# Patient Record
Sex: Female | Born: 1960 | Race: White | Hispanic: No | Marital: Married | Smoking: Never smoker
Health system: Southern US, Community
[De-identification: ages and names within clinical notes are randomized; demographics above are authoritative.]

## PROBLEM LIST (undated history)

## (undated) DIAGNOSIS — Z85828 Personal history of other malignant neoplasm of skin: Secondary | ICD-10-CM

## (undated) DIAGNOSIS — I10 Essential (primary) hypertension: Secondary | ICD-10-CM

## (undated) DIAGNOSIS — Z9889 Other specified postprocedural states: Secondary | ICD-10-CM

## (undated) DIAGNOSIS — N8189 Other female genital prolapse: Secondary | ICD-10-CM

## (undated) DIAGNOSIS — R350 Frequency of micturition: Secondary | ICD-10-CM

## (undated) HISTORY — PX: TUBAL LIGATION: SHX77

---

## 1998-02-07 ENCOUNTER — Ambulatory Visit (HOSPITAL_COMMUNITY): Admission: RE | Admit: 1998-02-07 | Discharge: 1998-02-07 | Payer: Self-pay | Admitting: Family Medicine

## 1998-02-07 ENCOUNTER — Encounter: Payer: Self-pay | Admitting: Family Medicine

## 2000-01-02 ENCOUNTER — Encounter: Payer: Self-pay | Admitting: Family Medicine

## 2000-01-02 ENCOUNTER — Encounter: Admission: RE | Admit: 2000-01-02 | Discharge: 2000-01-02 | Payer: Self-pay | Admitting: Family Medicine

## 2000-06-08 ENCOUNTER — Other Ambulatory Visit: Admission: RE | Admit: 2000-06-08 | Discharge: 2000-06-08 | Payer: Self-pay | Admitting: Obstetrics and Gynecology

## 2000-07-25 ENCOUNTER — Encounter: Payer: Self-pay | Admitting: Neurosurgery

## 2000-07-25 ENCOUNTER — Encounter: Admission: RE | Admit: 2000-07-25 | Discharge: 2000-07-25 | Payer: Self-pay | Admitting: Neurosurgery

## 2000-08-09 ENCOUNTER — Encounter: Admission: RE | Admit: 2000-08-09 | Discharge: 2000-08-09 | Payer: Self-pay | Admitting: Neurosurgery

## 2000-08-09 ENCOUNTER — Encounter: Payer: Self-pay | Admitting: Neurosurgery

## 2001-08-21 ENCOUNTER — Encounter: Admission: RE | Admit: 2001-08-21 | Discharge: 2001-08-21 | Payer: Self-pay | Admitting: Family Medicine

## 2001-08-21 ENCOUNTER — Encounter: Payer: Self-pay | Admitting: Family Medicine

## 2001-08-24 ENCOUNTER — Other Ambulatory Visit: Admission: RE | Admit: 2001-08-24 | Discharge: 2001-08-24 | Payer: Self-pay | Admitting: Obstetrics and Gynecology

## 2001-12-21 ENCOUNTER — Ambulatory Visit (HOSPITAL_COMMUNITY): Admission: RE | Admit: 2001-12-21 | Discharge: 2001-12-21 | Payer: Self-pay | Admitting: *Deleted

## 2001-12-21 ENCOUNTER — Encounter: Payer: Self-pay | Admitting: *Deleted

## 2002-09-12 ENCOUNTER — Other Ambulatory Visit: Admission: RE | Admit: 2002-09-12 | Discharge: 2002-09-12 | Payer: Self-pay | Admitting: Obstetrics and Gynecology

## 2002-09-20 ENCOUNTER — Encounter: Payer: Self-pay | Admitting: Obstetrics and Gynecology

## 2002-09-20 ENCOUNTER — Encounter: Admission: RE | Admit: 2002-09-20 | Discharge: 2002-09-20 | Payer: Self-pay | Admitting: Obstetrics and Gynecology

## 2003-10-16 ENCOUNTER — Other Ambulatory Visit: Admission: RE | Admit: 2003-10-16 | Discharge: 2003-10-16 | Payer: Self-pay | Admitting: Obstetrics and Gynecology

## 2004-01-15 ENCOUNTER — Ambulatory Visit (HOSPITAL_COMMUNITY): Admission: RE | Admit: 2004-01-15 | Discharge: 2004-01-15 | Payer: Self-pay | Admitting: Obstetrics and Gynecology

## 2004-12-01 ENCOUNTER — Other Ambulatory Visit: Admission: RE | Admit: 2004-12-01 | Discharge: 2004-12-01 | Payer: Self-pay | Admitting: Obstetrics and Gynecology

## 2004-12-02 HISTORY — PX: LAPAROSCOPIC ASSISTED VAGINAL HYSTERECTOMY: SHX5398

## 2004-12-17 ENCOUNTER — Observation Stay (HOSPITAL_COMMUNITY): Admission: RE | Admit: 2004-12-17 | Discharge: 2004-12-18 | Payer: Self-pay | Admitting: Obstetrics and Gynecology

## 2010-06-04 ENCOUNTER — Institutional Professional Consult (permissible substitution): Payer: Self-pay | Admitting: Cardiology

## 2010-06-20 ENCOUNTER — Encounter: Payer: Self-pay | Admitting: Cardiology

## 2010-06-24 ENCOUNTER — Encounter: Payer: Self-pay | Admitting: Cardiology

## 2010-06-24 ENCOUNTER — Ambulatory Visit (INDEPENDENT_AMBULATORY_CARE_PROVIDER_SITE_OTHER): Payer: 59 | Admitting: Cardiology

## 2010-06-24 DIAGNOSIS — E7800 Pure hypercholesterolemia, unspecified: Secondary | ICD-10-CM

## 2010-06-24 DIAGNOSIS — I10 Essential (primary) hypertension: Secondary | ICD-10-CM | POA: Insufficient documentation

## 2010-06-24 DIAGNOSIS — E7801 Familial hypercholesterolemia: Secondary | ICD-10-CM

## 2010-06-24 NOTE — Progress Notes (Signed)
   Patient ID: Melissa [name], female    DOB: 1961/01/31, 50 y.o.   MRN: [id number]  HPI  Melissa [name] is referred by Dr Arelia Sneddon for the evaluation and management of her CRF's. She has a history of HTN that is well controlled for the last 5 years. Her TC is 235 but her HDL she thinks is 60. She will send [place] the numbers. Her father had a heart attack at age 50 but was a smoker. Her brother had an MI at 96 and was not. She is sedentary and is overweight.  She denies angina or chest pain.    Review of Systems  All other systems reviewed and are negative.      Physical Exam  Nursing note and vitals reviewed. Constitutional: She is oriented to person, place, and time. She appears well-developed and well-nourished.       overweight  HENT:  Head: Normocephalic and atraumatic.  Eyes: EOM are normal. Pupils are equal, round, and reactive to light.  Neck: Normal range of motion. Neck supple. No JVD present. No tracheal deviation present. No thyromegaly present.  Cardiovascular: Normal rate, regular rhythm, normal heart sounds and intact distal pulses.  Exam reveals no gallop.   No murmur heard. Pulmonary/Chest: Effort normal and breath sounds normal.  Abdominal: Soft. Bowel sounds are normal.  Musculoskeletal: Normal range of motion. She exhibits no edema.  Neurological: She is alert and oriented to person, place, and time.  Skin: Skin is warm and dry.  Psychiatric: She has a normal mood and affect.

## 2010-06-24 NOTE — Patient Instructions (Signed)
Your physician discussed the importance of regular exercise and recommended that you start or continue a regular exercise program for good health.   30 minutes a day for a total of 3 hours per week Your physician encouraged you to lose weight for better health.  Your physician recommends that you schedule a follow-up appointment in: 1 year with Dr. Daleen Squibb

## 2010-10-16 ENCOUNTER — Emergency Department (HOSPITAL_COMMUNITY)
Admission: EM | Admit: 2010-10-16 | Discharge: 2010-10-16 | Disposition: A | Discharge status: Home / Self Care | Payer: Managed Care, Other (non HMO) | Attending: Emergency Medicine | Admitting: Emergency Medicine

## 2010-10-16 DIAGNOSIS — M549 Dorsalgia, unspecified: Secondary | ICD-10-CM | POA: Insufficient documentation

## 2010-10-16 DIAGNOSIS — F3289 Other specified depressive episodes: Secondary | ICD-10-CM | POA: Insufficient documentation

## 2010-10-16 DIAGNOSIS — R42 Dizziness and giddiness: Secondary | ICD-10-CM | POA: Insufficient documentation

## 2010-10-16 DIAGNOSIS — I10 Essential (primary) hypertension: Secondary | ICD-10-CM | POA: Insufficient documentation

## 2010-10-16 DIAGNOSIS — Z79899 Other long term (current) drug therapy: Secondary | ICD-10-CM | POA: Insufficient documentation

## 2010-10-16 DIAGNOSIS — F329 Major depressive disorder, single episode, unspecified: Secondary | ICD-10-CM | POA: Insufficient documentation

## 2010-10-16 DIAGNOSIS — E785 Hyperlipidemia, unspecified: Secondary | ICD-10-CM | POA: Insufficient documentation

## 2010-10-16 DIAGNOSIS — R079 Chest pain, unspecified: Secondary | ICD-10-CM | POA: Insufficient documentation

## 2010-10-16 DIAGNOSIS — R109 Unspecified abdominal pain: Secondary | ICD-10-CM | POA: Insufficient documentation

## 2010-10-16 DIAGNOSIS — K219 Gastro-esophageal reflux disease without esophagitis: Secondary | ICD-10-CM | POA: Insufficient documentation

## 2010-10-16 LAB — POCT I-STAT, CHEM 8
Chloride: 104 mEq/L (ref 96–112)
Creatinine, Ser: 0.9 mg/dL (ref 0.50–1.10)
Hemoglobin: 11.2 g/dL — ABNORMAL LOW (ref 12.0–15.0)
Potassium: 4 mEq/L (ref 3.5–5.1)
Sodium: 141 mEq/L (ref 135–145)

## 2010-10-16 LAB — POCT I-STAT TROPONIN I: Troponin i, poc: 0 ng/mL (ref 0.00–0.08)

## 2012-10-09 ENCOUNTER — Other Ambulatory Visit: Payer: Self-pay | Admitting: Neurosurgery

## 2012-10-09 DIAGNOSIS — M4802 Spinal stenosis, cervical region: Secondary | ICD-10-CM

## 2012-10-12 ENCOUNTER — Other Ambulatory Visit: Payer: Self-pay | Admitting: Dermatology

## 2012-10-13 ENCOUNTER — Ambulatory Visit
Admission: RE | Admit: 2012-10-13 | Discharge: 2012-10-13 | Disposition: A | Discharge status: Home / Self Care | Payer: Managed Care, Other (non HMO) | Source: Ambulatory Visit | Attending: Neurosurgery | Admitting: Neurosurgery

## 2012-10-13 VITALS — BP 108/63 | HR 57

## 2012-10-13 DIAGNOSIS — M4802 Spinal stenosis, cervical region: Secondary | ICD-10-CM

## 2012-10-13 MED ORDER — MEPERIDINE HCL 100 MG/ML IJ SOLN
75.0000 mg | Freq: Once | INTRAMUSCULAR | Status: AC
  Administered 2012-10-13: 75 mg via INTRAMUSCULAR

## 2012-10-13 MED ORDER — IOHEXOL 300 MG/ML  SOLN
10.0000 mL | Freq: Once | INTRAMUSCULAR | Status: AC | PRN
  Administered 2012-10-13: 10 mL via INTRATHECAL

## 2012-10-13 MED ORDER — ONDANSETRON HCL 4 MG/2ML IJ SOLN
4.0000 mg | Freq: Once | INTRAMUSCULAR | Status: AC
  Administered 2012-10-13: 4 mg via INTRAMUSCULAR

## 2012-10-13 MED ORDER — DIAZEPAM 5 MG PO TABS
10.0000 mg | ORAL_TABLET | Freq: Once | ORAL | Status: AC
  Administered 2012-10-13: 10 mg via ORAL

## 2012-10-13 NOTE — Progress Notes (Signed)
Pt states she has been off paxil for the past 2 days.

## 2012-10-16 ENCOUNTER — Ambulatory Visit
Admission: RE | Admit: 2012-10-16 | Discharge: 2012-10-16 | Disposition: A | Discharge status: Home / Self Care | Payer: Managed Care, Other (non HMO) | Source: Ambulatory Visit | Attending: Neurosurgery | Admitting: Neurosurgery

## 2012-10-16 ENCOUNTER — Telehealth: Payer: Self-pay | Admitting: Radiology

## 2012-10-16 ENCOUNTER — Other Ambulatory Visit: Payer: Self-pay | Admitting: Neurosurgery

## 2012-10-16 DIAGNOSIS — G971 Other reaction to spinal and lumbar puncture: Secondary | ICD-10-CM

## 2012-10-16 MED ORDER — IOHEXOL 180 MG/ML  SOLN
1.0000 mL | Freq: Once | INTRAMUSCULAR | Status: AC | PRN
  Administered 2012-10-16: 1 mL via EPIDURAL

## 2012-10-16 NOTE — Progress Notes (Signed)
Dr. Carlota Raspberry in to speak with patient after Blood Patch and vasovagal reaction.  jkl

## 2012-10-16 NOTE — Telephone Encounter (Signed)
Pt called, had myelo on Friday and has been on bedrest all weekend. This am severe headache with nausea and vomiting. Will call Dr. Jeral Fruit for a blood patch order

## 2012-10-16 NOTE — Progress Notes (Signed)
20cc blood drawn from left AC space without difficulty for procedure; site unremarkable.  jkl 

## 2013-04-25 ENCOUNTER — Other Ambulatory Visit: Payer: Self-pay | Admitting: Dermatology

## 2013-06-06 ENCOUNTER — Other Ambulatory Visit: Payer: Self-pay | Admitting: Family Medicine

## 2013-06-06 ENCOUNTER — Ambulatory Visit
Admission: RE | Admit: 2013-06-06 | Discharge: 2013-06-06 | Disposition: A | Discharge status: Home / Self Care | Payer: Managed Care, Other (non HMO) | Source: Ambulatory Visit | Attending: Family Medicine | Admitting: Family Medicine

## 2013-06-06 DIAGNOSIS — M79609 Pain in unspecified limb: Secondary | ICD-10-CM

## 2013-07-31 ENCOUNTER — Other Ambulatory Visit: Payer: Self-pay | Admitting: Family Medicine

## 2013-07-31 ENCOUNTER — Ambulatory Visit
Admission: RE | Admit: 2013-07-31 | Discharge: 2013-07-31 | Disposition: A | Discharge status: Home / Self Care | Payer: Managed Care, Other (non HMO) | Source: Ambulatory Visit | Attending: Family Medicine | Admitting: Family Medicine

## 2013-07-31 DIAGNOSIS — S20212A Contusion of left front wall of thorax, initial encounter: Secondary | ICD-10-CM

## 2015-07-01 NOTE — H&P (Signed)
  Patient name Melissa [name], Elisama DICTATION# [id number] CSN# [id number]  [facility],Lizette Pazos S, MD 07/01/2015 5:58 PM

## 2015-07-01 NOTE — H&P (Signed)
NAME:  Melissa [name], Melissa [name]        ACCOUNT NO.:  [id number]  MEDICAL RECORD NO.:  [id number]  LOCATION:                                 FACILITY:  PHYSICIAN:  Juluis MireJohn S. Malarie Tappen, M.D.        DATE OF BIRTH:  DATE OF ADMISSION: DATE OF DISCHARGE:                             HISTORY & PHYSICAL   DATE OF SURGERY:  June 8th at [facility].  HISTORY OF PRESENT ILLNESS:  The patient is a 55 year old, gravida 1, para 1 female who presents for repair of pelvic relaxation.  She has a known rectocele.  It has become more symptomatic.  We had discussed options including surgical repair of pessary.  She is in favor of proceeding with surgery.  We are going to have it set, although we may also do an anterior repair and sacrospinous ligament suspension if needed.  The nature of procedure and alternatives have been discussed.  ALLERGIES:  She has no known drug allergies.  MEDICATIONS:  She is on lisinopril with hydrochlorothiazide, Paxil 10 mg daily, and a baby aspirin.  PAST MEDICAL HISTORY:  She had a previous laparoscopic-assisted vaginal hysterectomy, that was done in 2006 for management of adenomyosis.  She has had 1 vaginal delivery.  She has had a previous laparoscopic tubal.  FAMILY HISTORY:  There is a strong family history of heart disease.  She has seen a cardiologist in the past where workup was unremarkable. Otherwise, family history is noncontributory.  SOCIAL HISTORY:  Reveals no tobacco or alcohol use.  REVIEW OF SYSTEMS:  Noncontributory.  PHYSICAL EXAMINATION:  VITAL SIGNS:  The patient is afebrile with stable vital signs. HEENT:  The patient is normocephalic.  Pupils are equal, round, and reactive to light and accommodation.  Extraocular movements were intact. Sclerae and conjunctivae were clear.  Oropharynx was clear. BREASTS:  Not examined. LUNGS:  Clear. CARDIOVASCULAR:  Regular rhythm and rate without murmurs or gallops. There were no carotid or  abdominal bruits. ABDOMEN:  Abdominal exam is benign.  No mass, organomegaly, or tenderness. PELVIC:  Normal external genitalia.  Vaginal mucosa reveals a very prominent rectocele.  Minimal cystocele.  Cuff intact.  Bimanual is unremarkable. EXTREMITIES:  Trace edema. NEUROLOGIC:  Grossly within normal limits.  IMPRESSION:  Worsening pelvic relaxation mainly in the form of rectocele.  PLAN:  We are going to proceed with repair probably just of the rectocele, but leave it open for an anterior repair and sacrospinous ligament suspension.  The nature of the procedure have been discussed. The risks have been explained.  The risks include risk of infection, risk of hemorrhage that could require transfusion with the risk of AIDS, hepatitis.  There is a risk of injury to organs including bladder, bowel, ureters that could require further exploratory surgery.  Risk of deep venous thrombosis and pulmonary embolus.  There was also the potential for recurrent pelvic relaxation and other associated complications.  The patient voiced understanding of the indications, risks, and alternatives.     Juluis MireJohn S. Jahree Dermody, M.D.     JSM/MEDQ  D:  07/01/2015  T:  07/01/2015  Job:  [id number]

## 2015-07-07 ENCOUNTER — Encounter (HOSPITAL_BASED_OUTPATIENT_CLINIC_OR_DEPARTMENT_OTHER): Payer: Self-pay | Admitting: *Deleted

## 2015-07-07 NOTE — Progress Notes (Signed)
NPO AFTER MN.  ARRIVE AT 0600.  NEEDS EKG.  GETTING LAB WORK DONE Tuesday 07-08-2015 (CBC , CMET).  WILL TAKE PAXIL AM DOS W/ SIPS OF WATER.

## 2015-07-09 DIAGNOSIS — N816 Rectocele: Secondary | ICD-10-CM | POA: Diagnosis present

## 2015-07-09 DIAGNOSIS — I10 Essential (primary) hypertension: Secondary | ICD-10-CM | POA: Diagnosis not present

## 2015-07-09 DIAGNOSIS — N8110 Cystocele, unspecified: Secondary | ICD-10-CM | POA: Diagnosis not present

## 2015-07-09 LAB — COMPREHENSIVE METABOLIC PANEL
ALBUMIN: 4.5 g/dL (ref 3.5–5.0)
ALK PHOS: 83 U/L (ref 38–126)
ALT: 15 U/L (ref 14–54)
AST: 14 U/L — AB (ref 15–41)
Anion gap: 7 (ref 5–15)
BUN: 25 mg/dL — ABNORMAL HIGH (ref 6–20)
CALCIUM: 9.3 mg/dL (ref 8.9–10.3)
CHLORIDE: 106 mmol/L (ref 101–111)
CO2: 27 mmol/L (ref 22–32)
CREATININE: 0.82 mg/dL (ref 0.44–1.00)
GFR calc Af Amer: >60 mL/min (ref >60)
GFR calc non Af Amer: >60 mL/min (ref >60)
GLUCOSE: 92 mg/dL (ref 65–99)
Potassium: 4 mmol/L (ref 3.5–5.1)
SODIUM: 140 mmol/L (ref 135–145)
Total Bilirubin: 0.7 mg/dL (ref 0.3–1.2)
Total Protein: 7.6 g/dL (ref 6.5–8.1)

## 2015-07-09 LAB — CBC
HCT: 37.1 % (ref 36.0–46.0)
HEMOGLOBIN: 12.1 g/dL (ref 12.0–15.0)
MCH: 28.9 pg (ref 26.0–34.0)
MCHC: 32.6 g/dL (ref 30.0–36.0)
MCV: 88.5 fL (ref 78.0–100.0)
PLATELETS: 220 10*3/uL (ref 150–400)
RBC: 4.19 MIL/uL (ref 3.87–5.11)
RDW: 13.3 % (ref 11.5–15.5)
WBC: 5.2 10*3/uL (ref 4.0–10.5)

## 2015-07-09 LAB — ABO/RH: ABO/RH(D): O POS

## 2015-07-09 LAB — TYPE AND SCREEN
ABO/RH(D): O POS
Antibody Screen: NEGATIVE

## 2015-07-10 ENCOUNTER — Encounter (HOSPITAL_BASED_OUTPATIENT_CLINIC_OR_DEPARTMENT_OTHER): Payer: Self-pay | Admitting: Anesthesiology

## 2015-07-10 ENCOUNTER — Other Ambulatory Visit: Payer: Self-pay

## 2015-07-10 ENCOUNTER — Ambulatory Visit (HOSPITAL_BASED_OUTPATIENT_CLINIC_OR_DEPARTMENT_OTHER): Payer: Managed Care, Other (non HMO) | Admitting: Anesthesiology

## 2015-07-10 ENCOUNTER — Ambulatory Visit (HOSPITAL_BASED_OUTPATIENT_CLINIC_OR_DEPARTMENT_OTHER)
Admission: RE | Admit: 2015-07-10 | Discharge: 2015-07-11 | Disposition: A | Discharge status: Home / Self Care | Payer: Managed Care, Other (non HMO) | Source: Ambulatory Visit | Attending: Obstetrics and Gynecology | Admitting: Obstetrics and Gynecology

## 2015-07-10 ENCOUNTER — Encounter (HOSPITAL_COMMUNITY)
Admission: RE | Disposition: A | Discharge status: Home / Self Care | Payer: Self-pay | Source: Ambulatory Visit | Attending: Obstetrics and Gynecology

## 2015-07-10 DIAGNOSIS — I10 Essential (primary) hypertension: Secondary | ICD-10-CM | POA: Insufficient documentation

## 2015-07-10 DIAGNOSIS — N816 Rectocele: Secondary | ICD-10-CM | POA: Insufficient documentation

## 2015-07-10 DIAGNOSIS — N8189 Other female genital prolapse: Secondary | ICD-10-CM | POA: Diagnosis present

## 2015-07-10 DIAGNOSIS — N8110 Cystocele, unspecified: Secondary | ICD-10-CM | POA: Insufficient documentation

## 2015-07-10 HISTORY — PX: ANTERIOR AND POSTERIOR REPAIR WITH SACROSPINOUS FIXATION: SHX6536

## 2015-07-10 HISTORY — DX: Other specified postprocedural states: Z98.890

## 2015-07-10 HISTORY — DX: Personal history of other malignant neoplasm of skin: Z85.828

## 2015-07-10 HISTORY — DX: Essential (primary) hypertension: I10

## 2015-07-10 HISTORY — DX: Frequency of micturition: R35.0

## 2015-07-10 HISTORY — DX: Other female genital prolapse: N81.89

## 2015-07-10 SURGERY — ANTERIOR AND POSTERIOR REPAIR WITH SACROSPINOUS FIXATION
Anesthesia: General

## 2015-07-10 MED ORDER — ONDANSETRON HCL 4 MG/2ML IJ SOLN
4.0000 mg | Freq: Once | INTRAMUSCULAR | Status: DC | PRN
  Filled 2015-07-10: qty 2

## 2015-07-10 MED ORDER — HYDROCHLOROTHIAZIDE 12.5 MG PO CAPS
12.5000 mg | ORAL_CAPSULE | Freq: Every day | ORAL | Status: DC
  Administered 2015-07-10 – 2015-07-11 (×2): 12.5 mg via ORAL
  Filled 2015-07-10 (×2): qty 1

## 2015-07-10 MED ORDER — LISINOPRIL-HYDROCHLOROTHIAZIDE 20-12.5 MG PO TABS: 1.0000 | ORAL_TABLET | Freq: Every morning | ORAL | Status: DC

## 2015-07-10 MED ORDER — DEXAMETHASONE SODIUM PHOSPHATE 10 MG/ML IJ SOLN
INTRAMUSCULAR | Status: AC
  Filled 2015-07-10: qty 1

## 2015-07-10 MED ORDER — FENTANYL CITRATE (PF) 100 MCG/2ML IJ SOLN
INTRAMUSCULAR | Status: AC
  Filled 2015-07-10: qty 2

## 2015-07-10 MED ORDER — LIDOCAINE HCL (CARDIAC) 20 MG/ML IV SOLN
INTRAVENOUS | Status: AC
  Filled 2015-07-10: qty 5

## 2015-07-10 MED ORDER — FENTANYL CITRATE (PF) 100 MCG/2ML IJ SOLN
INTRAMUSCULAR | Status: DC | PRN
  Administered 2015-07-10: 25 ug via INTRAVENOUS
  Administered 2015-07-10: 100 ug via INTRAVENOUS
  Administered 2015-07-10 (×3): 25 ug via INTRAVENOUS

## 2015-07-10 MED ORDER — MEPERIDINE HCL 25 MG/ML IJ SOLN
6.2500 mg | INTRAMUSCULAR | Status: DC | PRN
  Filled 2015-07-10: qty 1

## 2015-07-10 MED ORDER — HYDROMORPHONE 1 MG/ML IV SOLN: INTRAVENOUS | Status: DC

## 2015-07-10 MED ORDER — HYDROMORPHONE HCL 1 MG/ML IJ SOLN
0.2500 mg | INTRAMUSCULAR | Status: DC | PRN
  Administered 2015-07-10: 0.5 mg via INTRAVENOUS
  Filled 2015-07-10: qty 1

## 2015-07-10 MED ORDER — PROPOFOL 10 MG/ML IV BOLUS
INTRAVENOUS | Status: DC | PRN
  Administered 2015-07-10: 200 mg via INTRAVENOUS

## 2015-07-10 MED ORDER — ONDANSETRON HCL 4 MG/2ML IJ SOLN: 4.0000 mg | Freq: Four times a day (QID) | INTRAMUSCULAR | Status: DC | PRN

## 2015-07-10 MED ORDER — MIDAZOLAM HCL 5 MG/5ML IJ SOLN
INTRAMUSCULAR | Status: DC | PRN
  Administered 2015-07-10: 2 mg via INTRAVENOUS

## 2015-07-10 MED ORDER — ONDANSETRON HCL 4 MG/2ML IJ SOLN
INTRAMUSCULAR | Status: DC | PRN
  Administered 2015-07-10: 4 mg via INTRAVENOUS

## 2015-07-10 MED ORDER — LIDOCAINE-EPINEPHRINE (PF) 1 %-1:200000 IJ SOLN
INTRAMUSCULAR | Status: DC | PRN
  Administered 2015-07-10: 7 mL

## 2015-07-10 MED ORDER — HYDROMORPHONE HCL 1 MG/ML IJ SOLN
INTRAMUSCULAR | Status: AC
  Filled 2015-07-10: qty 1

## 2015-07-10 MED ORDER — LISINOPRIL 20 MG PO TABS
20.0000 mg | ORAL_TABLET | Freq: Every day | ORAL | Status: DC
  Administered 2015-07-10 – 2015-07-11 (×2): 20 mg via ORAL
  Filled 2015-07-10 (×2): qty 1

## 2015-07-10 MED ORDER — CEFAZOLIN SODIUM-DEXTROSE 2-4 GM/100ML-% IV SOLN
2.0000 g | INTRAVENOUS | Status: DC
  Filled 2015-07-10: qty 100

## 2015-07-10 MED ORDER — CEFAZOLIN SODIUM-DEXTROSE 2-4 GM/100ML-% IV SOLN
INTRAVENOUS | Status: AC
  Filled 2015-07-10: qty 100

## 2015-07-10 MED ORDER — MIDAZOLAM HCL 2 MG/2ML IJ SOLN
INTRAMUSCULAR | Status: AC
  Filled 2015-07-10: qty 2

## 2015-07-10 MED ORDER — PRENATAL MULTIVITAMIN CH
1.0000 | ORAL_TABLET | Freq: Every day | ORAL | Status: DC
  Administered 2015-07-10: 1 via ORAL
  Filled 2015-07-10 (×2): qty 1

## 2015-07-10 MED ORDER — PROPOFOL 10 MG/ML IV BOLUS
INTRAVENOUS | Status: AC
  Filled 2015-07-10: qty 20

## 2015-07-10 MED ORDER — CEFAZOLIN SODIUM-DEXTROSE 2-3 GM-% IV SOLR
INTRAVENOUS | Status: DC | PRN
  Administered 2015-07-10: 2 g via INTRAVENOUS

## 2015-07-10 MED ORDER — DIPHENHYDRAMINE HCL 12.5 MG/5ML PO ELIX: 12.5000 mg | ORAL_SOLUTION | Freq: Four times a day (QID) | ORAL | Status: DC | PRN

## 2015-07-10 MED ORDER — LACTATED RINGERS IV SOLN
INTRAVENOUS | Status: DC
  Administered 2015-07-10: 15:00:00 via INTRAVENOUS

## 2015-07-10 MED ORDER — OXYCODONE-ACETAMINOPHEN 5-325 MG PO TABS
1.0000 | ORAL_TABLET | ORAL | Status: DC | PRN
  Administered 2015-07-10: 2 via ORAL
  Administered 2015-07-10 – 2015-07-11 (×2): 1 via ORAL
  Filled 2015-07-10 (×2): qty 2
  Filled 2015-07-10: qty 1

## 2015-07-10 MED ORDER — IBUPROFEN 200 MG PO TABS: 600.0000 mg | ORAL_TABLET | Freq: Four times a day (QID) | ORAL | Status: DC | PRN

## 2015-07-10 MED ORDER — NALOXONE HCL 0.4 MG/ML IJ SOLN: 0.4000 mg | INTRAMUSCULAR | Status: DC | PRN

## 2015-07-10 MED ORDER — LACTATED RINGERS IV SOLN
INTRAVENOUS | Status: DC
  Administered 2015-07-10 (×3): via INTRAVENOUS
  Filled 2015-07-10: qty 1000

## 2015-07-10 MED ORDER — ONDANSETRON HCL 4 MG/2ML IJ SOLN
INTRAMUSCULAR | Status: AC
  Filled 2015-07-10: qty 2

## 2015-07-10 MED ORDER — ZOLPIDEM TARTRATE 5 MG PO TABS: 5.0000 mg | ORAL_TABLET | Freq: Every evening | ORAL | Status: DC | PRN

## 2015-07-10 MED ORDER — SODIUM CHLORIDE 0.9% FLUSH: 9.0000 mL | INTRAVENOUS | Status: DC | PRN

## 2015-07-10 MED ORDER — LIDOCAINE 2% (20 MG/ML) 5 ML SYRINGE
INTRAMUSCULAR | Status: DC | PRN
  Administered 2015-07-10: 100 mg via INTRAVENOUS

## 2015-07-10 MED ORDER — DIPHENHYDRAMINE HCL 50 MG/ML IJ SOLN: 12.5000 mg | Freq: Four times a day (QID) | INTRAMUSCULAR | Status: DC | PRN

## 2015-07-10 MED ORDER — DEXAMETHASONE SODIUM PHOSPHATE 4 MG/ML IJ SOLN
INTRAMUSCULAR | Status: DC | PRN
  Administered 2015-07-10: 10 mg via INTRAVENOUS

## 2015-07-10 MED ORDER — PAROXETINE HCL 10 MG PO TABS
10.0000 mg | ORAL_TABLET | Freq: Every morning | ORAL | Status: DC
  Administered 2015-07-11: 10 mg via ORAL
  Filled 2015-07-10: qty 1

## 2015-07-10 SURGICAL SUPPLY — 54 items
BAG URINE DRAINAGE (UROLOGICAL SUPPLIES) ×3 IMPLANT
BLADE SURG 15 STRL LF DISP TIS (BLADE) ×1 IMPLANT
BLADE SURG 15 STRL SS (BLADE) ×3
BNDG GAUZE ELAST 4 BULKY (GAUZE/BANDAGES/DRESSINGS) IMPLANT
CATH FOLEY 2WAY SLVR  5CC 14FR (CATHETERS) ×2
CATH FOLEY 2WAY SLVR 5CC 14FR (CATHETERS) ×1 IMPLANT
CATH ROBINSON RED A/P 16FR (CATHETERS) ×3 IMPLANT
COVER BACK TABLE 60X90IN (DRAPES) ×3 IMPLANT
COVER MAYO STAND STRL (DRAPES) ×6 IMPLANT
DECANTER SPIKE VIAL GLASS SM (MISCELLANEOUS) IMPLANT
DEVICE CAPIO SLIM SINGLE (INSTRUMENTS) IMPLANT
DEVICE CAPIO SUTURING (INSTRUMENTS)
DEVICE CAPIO SUTURING OPC (INSTRUMENTS) IMPLANT
DRAPE LG THREE QUARTER DISP (DRAPES) ×6 IMPLANT
DRAPE UNDERBUTTOCKS STRL (DRAPE) ×3 IMPLANT
DRSG KUZMA FLUFF (GAUZE/BANDAGES/DRESSINGS) ×2 IMPLANT
GLOVE BIO SURGEON STRL SZ7 (GLOVE) ×6 IMPLANT
GLOVE BIOGEL PI IND STRL 6.5 (GLOVE) ×1 IMPLANT
GLOVE BIOGEL PI INDICATOR 6.5 (GLOVE) ×2
GOWN STRL REUS W/ TWL LRG LVL3 (GOWN DISPOSABLE) ×2 IMPLANT
GOWN STRL REUS W/TWL LRG LVL3 (GOWN DISPOSABLE) ×6
HOLDER FOLEY CATH W/STRAP (MISCELLANEOUS) ×3 IMPLANT
KIT ROOM TURNOVER WOR (KITS) ×3 IMPLANT
LEGGING LITHOTOMY PAIR STRL (DRAPES) ×3 IMPLANT
LIQUID BAND (GAUZE/BANDAGES/DRESSINGS) ×3 IMPLANT
MANIFOLD NEPTUNE II (INSTRUMENTS) IMPLANT
NDL MAYO 6 CRC TAPER PT (NEEDLE) ×1 IMPLANT
NDL SPNL 22GX3.5 QUINCKE BK (NEEDLE) ×1 IMPLANT
NEEDLE HYPO 22GX1.5 SAFETY (NEEDLE) IMPLANT
NEEDLE MAYO 6 CRC TAPER PT (NEEDLE) ×3 IMPLANT
NEEDLE SPNL 22GX3.5 QUINCKE BK (NEEDLE) ×3 IMPLANT
NS IRRIG 500ML POUR BTL (IV SOLUTION) ×3 IMPLANT
PACK BASIN DAY SURGERY FS (CUSTOM PROCEDURE TRAY) ×3 IMPLANT
PAD OB MATERNITY 4.3X12.25 (PERSONAL CARE ITEMS) ×3 IMPLANT
PAD PREP 24X48 CUFFED NSTRL (MISCELLANEOUS) ×3 IMPLANT
SET IRRIG Y TYPE TUR BLADDER L (SET/KITS/TRAYS/PACK) IMPLANT
SUT CAPIO POLYGLYCOLIC (SUTURE) IMPLANT
SUT CHROMIC 2 0 SH (SUTURE) ×3 IMPLANT
SUT CHROMIC 3 0 SH 27 (SUTURE) ×3 IMPLANT
SUT GUT CHROMIC 3 0 (SUTURE) IMPLANT
SUT MON AB 3-0 SH 27 (SUTURE)
SUT MON AB 3-0 SH27 (SUTURE) IMPLANT
SUT VIC AB 2-0 CT1 27 (SUTURE) ×6
SUT VIC AB 2-0 CT1 TAPERPNT 27 (SUTURE) IMPLANT
SUT VIC AB 2-0 CT2 27 (SUTURE) ×9 IMPLANT
SUT VIC AB 2-0 SH 18 (SUTURE) ×3 IMPLANT
SUT VIC AB 2-0 UR6 27 (SUTURE) ×14 IMPLANT
SYR CONTROL 10ML LL (SYRINGE) ×3 IMPLANT
TOWEL OR 17X24 6PK STRL BLUE (TOWEL DISPOSABLE) ×6 IMPLANT
TRAY DSU PREP LF (CUSTOM PROCEDURE TRAY) ×3 IMPLANT
TUBE CONNECTING 12'X1/4 (SUCTIONS) ×1
TUBE CONNECTING 12X1/4 (SUCTIONS) ×2 IMPLANT
WATER STERILE IRR 500ML POUR (IV SOLUTION) ×3 IMPLANT
YANKAUER SUCT BULB TIP NO VENT (SUCTIONS) ×3 IMPLANT

## 2015-07-10 NOTE — Progress Notes (Signed)
Patient ID: Melissa [name], female   DOB: 01/11/1961, 55 y.o.   MRN: [id number] AF VSS MINIMAL BLEEDING GOODUO ABD SOFT

## 2015-07-10 NOTE — Transfer of Care (Signed)
Immediate Anesthesia Transfer of Care Note  Patient: Melissa [name]  Procedure(s) Performed: Procedure(s) with comments: ANTERIOR AND POSTERIOR REPAIR WITH SACROSPINOUS LIGAMENT SUSPENSION (N/A) - NEED BED  Patient Location: PACU  Anesthesia Type:General  Level of Consciousness: sedated and obtunded  Airway & Oxygen Therapy: Patient Spontanous Breathing and Patient connected to face mask oxygen  Post-op Assessment: Report given to RN and Post -op Vital signs reviewed and stable  Post vital signs: Reviewed and stable  Last Vitals:  Filed Vitals:   07/10/15 0621 07/10/15 0830  BP: 125/71 115/87  Pulse: 72 71  Temp: 36.7 C 36.8 C  Resp: 16 10    Last Pain:  Filed Vitals:   07/10/15 0832  PainSc: 0-No pain      Patients Stated Pain Goal: 6 (07/10/15 0646)  Complications: No apparent anesthesia complications

## 2015-07-10 NOTE — Brief Op Note (Signed)
07/10/2015  8:20 AM  PATIENT:  Melissa [name]  55 y.o. female  PRE-OPERATIVE DIAGNOSIS:  PELVIC RELAXATION  POST-OPERATIVE DIAGNOSIS:  PELVIC RELAXATION  PROCEDURE:  Procedure(s) with comments: ANTERIOR AND POSTERIOR REPAIR WITH SACROSPINOUS LIGAMENT SUSPENSION (N/A) - NEED BED  SURGEON:  Surgeon(s) and Role:    * Richardean ChimeraJohn Yacine Garriga, MD - Primary    * Harold HedgeJames Tomblin, MD - Assisting  PHYSICIAN ASSISTANT:   ASSISTANTS: tomblin   ANESTHESIA:   local and general  EBL:  Total I/O In: 1000 [I.V.:1000] Out: -   BLOOD ADMINISTERED:none  DRAINS: Urinary Catheter (Foley)   LOCAL MEDICATIONS USED:  XYLOCAINE   SPECIMEN:  No Specimen  DISPOSITION OF SPECIMEN:  N/A  COUNTS:  YES  TOURNIQUET:  * No tourniquets in log *  DICTATION: .Other Dictation: Dictation Number [id number]  PLAN OF CARE: Admit for overnight observation  PATIENT DISPOSITION:  PACU - hemodynamically stable.   Delay start of Pharmacological VTE agent (>24hrs) due to surgical blood loss or risk of bleeding: no

## 2015-07-10 NOTE — H&P (Signed)
  History and physical exam unchanged 

## 2015-07-10 NOTE — Op Note (Signed)
NAME:  Melissa [name], [name]        ACCOUNT NO.:  [id number]  MEDICAL RECORD NO.:  [id number]  LOCATION:                                 FACILITY:  PHYSICIAN:  Juluis MireJohn S. Pistol Kessenich, M.D.        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  OPERATIVE PROCEDURE:  Anterior colporrhaphy and posterior colporrhaphy.  SURGEON:  Juluis MireJohn S. Melisha Eggleton, M.D.  Threasa HeadsASSISTANNada Boozer:  [place].  ANESTHESIA:  General endotracheal.  ESTIMATED BLOOD LOSS:  150 mL.  PACKS:  Vaginal pack.  DRAINS:  Urethral Foley.  INTRAOPERATIVE BLOOD PLACED:  None.  COMPLICATIONS:  None.  INDICATIONS:  As dictated in history and physical.  PROCEDURE IN DETAIL:  The patient was taken to the OR and placed in supine position.  After satisfactory level of general anesthesia was obtained, the patient was placed in dorsal lithotomy position using Allen stirrups.  The perineum and vagina were prepped out with Betadine. Bladder was emptied by in-and-out catheterization.  The patient was then draped in sterile field.  Visualization did reveal a mild cystocele and a prominent rectocele, her cuff was well supported.  Uterus was surgically absent.  We first went to the anterior repair.  The vaginal mucosa was infiltrated with 1% Xylocaine with epinephrine.  Incision was made in the vaginal mucosa in the midline and extended to the vaginal apex.  A vertical incision was then made in the vaginal mucosa at the apex.  We then dissected the vaginal mucosa from the underlying pubocervical vaginal fascia.  We dissected using sharp and blunt dissection.  Once we had everything freed up, we reapproximated the pubocervical fascia in the midline with interrupted sutures of 2-0 Vicryl.  We trimmed the vaginal edges of the  vaginal mucosa, then reapproximated with a running suture of 2-0 Vicryl.  We had very good support and good hemostasis.  Next, we went to the posterior repair.  The vaginal mucosa was infiltrated with 1% Xylocaine with epinephrine.  A V-incision was made in the perineal body and the skin was dissected up to the vaginal opening and trimmed.  We then began dissecting the vaginal mucosa from the underlying perirectal fascia.  We then made a midline incision in the vaginal mucosa.  Using sharp dissection, perirectal fascia was dissected from the overlying vaginal mucosa using interrupted sutures of 2-0 Vicryl, reapproximated in the midline thereby reducing the rectocele.  There was no evidence of injury to the rectum.  At this point in time, we began closing the vaginal mucosa with a running suture of 2-0 Vicryl.  Perineal body was rebuilt with 2-0 Vicryl and skin was closed with running subcuticular 2-0 Vicryl.  Vaginal pack was put in place.  Foley was placed to straight drain.  Rectal exam was unremarkable.  At this point in time, the patient was taken out of the dorsal lithotomy position.  Sponge, instrument, and needle count was correct by circulating nurse x2.  Once extubated, the patient was transferred to recovery room in good condition.  Juluis Mire, M.D.     JSM/MEDQ  D:  07/10/2015  T:  07/10/2015  Job:  [id number]

## 2015-07-10 NOTE — Anesthesia Preprocedure Evaluation (Signed)
Anesthesia Evaluation  Patient identified by MRN, date of birth, ID band Patient awake    Reviewed: Allergy & Precautions, NPO status , Patient's Chart, lab work & pertinent test results  Airway Mallampati: I  TM Distance: >3 FB Neck ROM: Full    Dental   Pulmonary    Pulmonary exam normal        Cardiovascular hypertension, Pt. on medications Normal cardiovascular exam     Neuro/Psych    GI/Hepatic   Endo/Other    Renal/GU      Musculoskeletal   Abdominal   Peds  Hematology   Anesthesia Other Findings   Reproductive/Obstetrics                             Anesthesia Physical Anesthesia Plan  ASA: II  Anesthesia Plan: General   Post-op Pain Management:    Induction: Intravenous  Airway Management Planned: LMA  Additional Equipment:   Intra-op Plan:   Post-operative Plan: Extubation in OR  Informed Consent: I have reviewed the patients History and Physical, chart, labs and discussed the procedure including the risks, benefits and alternatives for the proposed anesthesia with the patient or authorized representative who has indicated his/her understanding and acceptance.     Plan Discussed with: CRNA and Surgeon  Anesthesia Plan Comments:         Anesthesia Quick Evaluation  

## 2015-07-10 NOTE — Anesthesia Postprocedure Evaluation (Signed)
Anesthesia Post Note  Patient: Melissa [name]  Procedure(s) Performed: Procedure(s) (LRB): ANTERIOR AND POSTERIOR REPAIR WITH SACROSPINOUS LIGAMENT SUSPENSION (N/A)  Patient location during evaluation: PACU Anesthesia Type: General Level of consciousness: awake and alert Pain management: pain level controlled Vital Signs Assessment: post-procedure vital signs reviewed and stable Respiratory status: spontaneous breathing, nonlabored ventilation, respiratory function stable and patient connected to nasal cannula oxygen Cardiovascular status: blood pressure returned to baseline and stable Postop Assessment: no signs of nausea or vomiting Anesthetic complications: no    Last Vitals:  Filed Vitals:   07/10/15 0830 07/10/15 0845  BP: 115/87 123/93  Pulse: 71 64  Temp: 36.8 C   Resp: 10 10    Last Pain:  Filed Vitals:   07/10/15 0852  PainSc: 0-No pain                 Lam Bjorklund DAVID

## 2015-07-10 NOTE — Anesthesia Procedure Notes (Signed)
Procedure Name: LMA Insertion Date/Time: 07/10/2015 7:27 AM Performed by: Gar GibbonKEETON, Khady Vandenberg S Pre-anesthesia Checklist: Patient identified, Emergency Drugs available, Suction available and Patient being monitored Patient Re-evaluated:Patient Re-evaluated prior to inductionOxygen Delivery Method: Circle system utilized Preoxygenation: Pre-oxygenation with 100% oxygen Intubation Type: IV induction Ventilation: Mask ventilation without difficulty LMA: LMA inserted LMA Size: 4.0 Number of attempts: 1 Airway Equipment and Method: Bite block Placement Confirmation: positive ETCO2 Tube secured with: Tape Dental Injury: Teeth and Oropharynx as per pre-operative assessment

## 2015-07-11 ENCOUNTER — Encounter (HOSPITAL_BASED_OUTPATIENT_CLINIC_OR_DEPARTMENT_OTHER): Payer: Self-pay | Admitting: Obstetrics and Gynecology

## 2015-07-11 DIAGNOSIS — N816 Rectocele: Secondary | ICD-10-CM | POA: Diagnosis not present

## 2015-07-11 LAB — CBC
HEMATOCRIT: 32.9 % — AB (ref 36.0–46.0)
HEMOGLOBIN: 10.8 g/dL — AB (ref 12.0–15.0)
MCH: 29.3 pg (ref 26.0–34.0)
MCHC: 32.8 g/dL (ref 30.0–36.0)
MCV: 89.2 fL (ref 78.0–100.0)
Platelets: 211 10*3/uL (ref 150–400)
RBC: 3.69 MIL/uL — ABNORMAL LOW (ref 3.87–5.11)
RDW: 13.4 % (ref 11.5–15.5)
WBC: 10.3 10*3/uL (ref 4.0–10.5)

## 2015-07-11 MED ORDER — OXYCODONE-ACETAMINOPHEN 7.5-325 MG PO TABS: 1.0000 | ORAL_TABLET | ORAL | Status: DC | PRN

## 2015-07-11 NOTE — Discharge Summary (Signed)
[name], Melissa         ACCOUNT NO.:  [id number]  MEDICAL RECORD NO.:  [id number]  LOCATION:  1502                         [facility]  PHYSICIAN:  Juluis MireJohn S. Edel Rivero, M.D.   DATE OF BIRTH:  1960/02/05  DATE OF ADMISSION:  07/10/2015 DATE OF DISCHARGE:  07/11/2015                              DISCHARGE SUMMARY   ADMITTING DIAGNOSIS:  Pelvic relaxation.  DISCHARGE DIAGNOSIS:  Pelvic relaxation.  OPERATIVE PROCEDURE:  Anterior and posterior colporrhaphy.  For a complete history and physical please see dictated note.  COURSE IN THE HOSPITAL:  The patient underwent above-noted surgery.  It went quite well.  Postoperatively she did well.  Her postop hemoglobin was 10.8.  On her 1st postop day, the pack was discontinued.  She was having minimal spotting, Foley was discontinued.  At that time she was afebrile.  Stable vital signs.  Abdomen was soft and nontender.  Bowel sounds were active.  In terms of complications, none were encountered during her stay in the hospital.  The patient is discharged home in stable condition.  DISPOSITION:  She is to avoid heavy lifting, vaginal entrance.  She should be careful driving.  She is instructed to call with heavy vaginal bleeding.  Fever should be reported.  Excessive pain or nausea or vomiting should be reported.  Instructed on signs and symptoms of deep venous thrombosis and pulmonary embolus.  Discharged home on Percocet as she needs for pain.     Juluis MireJohn S. Raegyn Renda, M.D.     JSM/MEDQ  D:  07/11/2015  T:  07/11/2015  Job:  [id number]

## 2015-07-11 NOTE — Discharge Summary (Signed)
  Patient name Melissa [name], Sri DICTATION# [id number] CSN# [id number]  [facility]Stanford Strauch S, MD 07/11/2015 7:59 AM

## 2015-07-11 NOTE — Progress Notes (Signed)
1 Day Post-Op Procedure(s) (LRB): ANTERIOR AND POSTERIOR REPAIR WITH SACROSPINOUS LIGAMENT SUSPENSION (N/A)  Subjective: Patient reports tolerating PO.    Objective: I have reviewed patient's vital signs, intake and output and labs.  General: alert GI: soft, non-tender; bowel sounds normal; no masses,  no organomegaly Vaginal Bleeding: minimal  Assessment: s/p Procedure(s) with comments: ANTERIOR AND POSTERIOR REPAIR WITH SACROSPINOUS LIGAMENT SUSPENSION (N/A) - NEED BED: stable  Plan: Discharge home     Melissa [name] S 07/11/2015, 7:56 AM

## 2016-03-19 ENCOUNTER — Other Ambulatory Visit: Payer: Self-pay | Admitting: Family Medicine

## 2016-03-25 ENCOUNTER — Other Ambulatory Visit: Payer: Self-pay | Admitting: Family Medicine

## 2016-03-25 DIAGNOSIS — R52 Pain, unspecified: Secondary | ICD-10-CM

## 2016-03-26 ENCOUNTER — Other Ambulatory Visit: Payer: Self-pay | Admitting: Family Medicine

## 2016-03-26 DIAGNOSIS — R1084 Generalized abdominal pain: Secondary | ICD-10-CM

## 2016-04-02 ENCOUNTER — Ambulatory Visit
Admission: RE | Admit: 2016-04-02 | Discharge: 2016-04-02 | Disposition: A | Discharge status: Home / Self Care | Payer: 59 | Source: Ambulatory Visit | Attending: Family Medicine | Admitting: Family Medicine

## 2016-04-02 DIAGNOSIS — R1084 Generalized abdominal pain: Secondary | ICD-10-CM

## 2017-01-11 ENCOUNTER — Emergency Department (HOSPITAL_COMMUNITY)
Admission: EM | Admit: 2017-01-11 | Discharge: 2017-01-11 | Disposition: A | Discharge status: Home / Self Care | Payer: 59 | Attending: Emergency Medicine | Admitting: Emergency Medicine

## 2017-01-11 ENCOUNTER — Other Ambulatory Visit: Payer: Self-pay

## 2017-01-11 ENCOUNTER — Encounter (HOSPITAL_COMMUNITY): Payer: Self-pay

## 2017-01-11 ENCOUNTER — Emergency Department (HOSPITAL_COMMUNITY): Payer: 59

## 2017-01-11 DIAGNOSIS — Z7982 Long term (current) use of aspirin: Secondary | ICD-10-CM | POA: Insufficient documentation

## 2017-01-11 DIAGNOSIS — I10 Essential (primary) hypertension: Secondary | ICD-10-CM | POA: Insufficient documentation

## 2017-01-11 DIAGNOSIS — R072 Precordial pain: Secondary | ICD-10-CM | POA: Insufficient documentation

## 2017-01-11 DIAGNOSIS — R079 Chest pain, unspecified: Secondary | ICD-10-CM | POA: Diagnosis present

## 2017-01-11 DIAGNOSIS — Z85828 Personal history of other malignant neoplasm of skin: Secondary | ICD-10-CM | POA: Insufficient documentation

## 2017-01-11 DIAGNOSIS — Z79899 Other long term (current) drug therapy: Secondary | ICD-10-CM | POA: Diagnosis not present

## 2017-01-11 LAB — CBC
HCT: 39.3 % (ref 36.0–46.0)
Hemoglobin: 12.9 g/dL (ref 12.0–15.0)
MCH: 29.5 pg (ref 26.0–34.0)
MCHC: 32.8 g/dL (ref 30.0–36.0)
MCV: 89.9 fL (ref 78.0–100.0)
Platelets: 188 10*3/uL (ref 150–400)
RBC: 4.37 MIL/uL (ref 3.87–5.11)
RDW: 12.9 % (ref 11.5–15.5)
WBC: 6.9 10*3/uL (ref 4.0–10.5)

## 2017-01-11 LAB — BASIC METABOLIC PANEL
ANION GAP: 10 (ref 5–15)
BUN: 22 mg/dL — ABNORMAL HIGH (ref 6–20)
CALCIUM: 9.2 mg/dL (ref 8.9–10.3)
CHLORIDE: 102 mmol/L (ref 101–111)
CO2: 25 mmol/L (ref 22–32)
Creatinine, Ser: 0.72 mg/dL (ref 0.44–1.00)
GFR calc non Af Amer: >60 mL/min (ref >60)
Glucose, Bld: 103 mg/dL — ABNORMAL HIGH (ref 65–99)
Potassium: 3.8 mmol/L (ref 3.5–5.1)
Sodium: 137 mmol/L (ref 135–145)

## 2017-01-11 LAB — I-STAT TROPONIN, ED
TROPONIN I, POC: 0.01 ng/mL (ref 0.00–0.08)
TROPONIN I, POC: 0.01 ng/mL (ref 0.00–0.08)

## 2017-01-11 LAB — I-STAT BETA HCG BLOOD, ED (MC, WL, AP ONLY)

## 2017-01-11 NOTE — ED Provider Notes (Signed)
[facility] Provider Note   CSN: [id number] Arrival date & time: 01/11/17  1846     History   Chief Complaint Chief Complaint  Patient presents with  . Chest Pain    HPI Melissa [name] is a 56 y.o. female.  The history is provided by the patient.  Chest Pain   This is a new problem. The current episode started 12 to 24 hours ago. The problem occurs rarely. The problem has been resolved. The pain is present in the substernal region. The pain is moderate. The quality of the pain is described as brief. Pertinent negatives include no abdominal pain, no diaphoresis, no exertional chest pressure, no hemoptysis, no nausea, no orthopnea, no palpitations, no PND, no shortness of breath and no vomiting. The treatment provided no relief.  Pertinent negatives for past medical history include no Marfan's syndrome and no MI.  Pertinent negatives for family medical history include: no Marfan's syndrome.  Procedure history is negative for stress echo.  # episodes lasting less than a minute.  No n/v/d.  No SOB.  No leg pain or swelling no surgeries.  No leg pain or swelling.    Past Medical History:  Diagnosis Date  . Frequency of urination   . History of basal cell carcinoma excision    multiple excision from chest area  . Hypertension   . Pelvic relaxation     Patient Active Problem List   Diagnosis Date Noted  . Pelvic relaxation 07/10/2015  . Hypertension 06/24/2010  . Hyperlipidemia type II 06/24/2010    Past Surgical History:  Procedure Laterality Date  . ANTERIOR AND POSTERIOR REPAIR WITH SACROSPINOUS FIXATION N/A 07/10/2015   Procedure: ANTERIOR AND POSTERIOR REPAIR WITH SACROSPINOUS LIGAMENT SUSPENSION;  Surgeon: Richardean ChimeraJohn McComb, MD;  [facility];  Service: Gynecology;  Laterality: N/A;  NEED BED  . LAPAROSCOPIC ASSISTED VAGINAL HYSTERECTOMY  11/ 2006  . TUBAL LIGATION Bilateral    laparoscopic    OB History     No data available       Home Medications    Prior to Admission medications   Medication Sig Start Date End Date Taking? Authorizing Provider  acetaminophen (TYLENOL) 325 MG tablet Take 650 mg by mouth every 6 (six) hours as needed for moderate pain.   Yes [provider]  aspirin 81 MG tablet Take 81 mg by mouth daily.     Yes [provider]  BIOTIN PO Take 1 tablet by mouth daily.   Yes [provider]  Cholecalciferol (VITAMIN D3) 1000 units CAPS Take 1 capsule by mouth daily.    Yes [provider]  ibuprofen (ADVIL,MOTRIN) 200 MG tablet Take 400 mg by mouth every 6 (six) hours as needed for moderate pain.   Yes [provider]  lisinopril (PRINIVIL,ZESTRIL) 20 MG tablet Take 20 mg by mouth daily.   Yes [provider]  oxyCODONE-acetaminophen (PERCOCET) 7.5-325 MG tablet Take 1 tablet by mouth every 4 (four) hours as needed for severe pain. 07/11/15  Yes McComb, Jonny RuizJohn, MD  PARoxetine (PAXIL) 10 MG tablet Take 10 mg by mouth every morning.   Yes [provider]  Probiotic Product (PROBIOTIC DAILY) CAPS Take 1 capsule by mouth daily as needed (upset stomach).    Yes [provider]  vitamin C (ASCORBIC ACID) 500 MG tablet Take 1,000 mg by mouth daily.   Yes [provider]    Family History History reviewed. No pertinent family history.  Social History Social History   Tobacco Use  . Smoking status: Never Smoker  . Smokeless tobacco: Never Used  Substance Use Topics  . Alcohol use: Yes    Alcohol/week: 2.4 oz    Types: 4 Glasses of wine per week  . Drug use: No     Allergies   Patient has no known allergies.   Review of Systems Review of Systems  Constitutional: Negative for diaphoresis.  Respiratory: Negative for hemoptysis, chest tightness and shortness of breath.   Cardiovascular: Positive for chest pain. Negative for palpitations, orthopnea, leg swelling and PND.  Gastrointestinal:  Negative for abdominal pain, nausea and vomiting.  All other systems reviewed and are negative.    Physical Exam Updated Vital Signs BP 109/68   Pulse (!) 53   Temp 98 F (36.7 C) (Oral)   Resp 14   Ht 5\' 5"  (1.651 m)   Wt 66.7 kg (147 lb)   SpO2 100%   BMI 24.46 kg/m   Physical Exam  Constitutional: She is oriented to person, place, and time. She appears well-developed and well-nourished.  HENT:  Head: Normocephalic and atraumatic.  Mouth/Throat: No oropharyngeal exudate.  Eyes: Conjunctivae are normal. Pupils are equal, round, and reactive to light.  Neck: Normal range of motion. Neck supple. No JVD present.  Cardiovascular: Normal rate, regular rhythm, normal heart sounds and intact distal pulses.  Pulmonary/Chest: Effort normal and breath sounds normal. No stridor. She has no wheezes. She has no rales.  Abdominal: Soft. Bowel sounds are normal. She exhibits no mass. There is no tenderness. There is no rebound and no guarding.  Musculoskeletal: Normal range of motion. She exhibits no edema, tenderness or deformity.  Neurological: She is alert and oriented to person, place, and time. She displays normal reflexes.  Skin: Skin is warm and dry. Capillary refill takes less than 2 seconds. She is not diaphoretic.  Psychiatric: She has a normal mood and affect.     ED Treatments / Results  Labs (all labs ordered are listed, but only abnormal results are displayed) Labs Reviewed  BASIC METABOLIC PANEL - Abnormal; Notable for the following components:      Result Value   Glucose, Bld 103 (*)    BUN 22 (*)    All other components within normal limits  CBC  I-STAT TROPONIN, ED  I-STAT BETA HCG BLOOD, ED ([facility], [facility], [facility] ONLY)  I-STAT TROPONIN, ED    EKG  EKG Interpretation  Date/Time:  Tuesday January 11 2017 18:53:42 EST Ventricular Rate:  70 PR Interval:    QRS Duration: 103 QT Interval:  405 QTC Calculation: 437 R Axis:   55 Text Interpretation:  Sinus rhythm  Confirmed by Tanai Bouler ([id number]) on 01/11/2017 11:03:11 PM       Radiology Dg Chest 2 View  Result Date: 01/11/2017 CLINICAL DATA:  Left-sided chest pain radiating to the back. Dyspnea and lightheadedness for 1 day. EXAM: CHEST  2 VIEW COMPARISON:  07/31/2013 FINDINGS: The lungs are clear. The pulmonary vasculature is normal. Heart size is normal. Hilar and mediastinal contours are unremarkable. There is no pleural effusion. IMPRESSION: No active cardiopulmonary disease. Electronically Signed   By: Ellery Plunk M.D.   On: 01/11/2017 20:14    Procedures Procedures (including critical care time)    HEART score 1: low risk for MACE.  Follow up with your PMD this week and cardiology.    Final Clinical Impressions(s) / ED Diagnoses    Follow up with your PMD in  2 days for recheck, cardiology.    Strict return precautions for chest pain exertional chest pain shortness of breath, neck or back pain, sweatiness, intractable vomiting, or diarrhea, abdominal pain, Inability to tolerate liquids or food, cough, altered mental status or any concerns. No signs of systemic illness or infection. The patient is nontoxic-appearing on exam and vital signs are within normal limits.    I have reviewed the triage vital signs and the nursing notes. Pertinent labs &imaging results that were available during my care of the patient were reviewed by me and considered in my medical decision making (see chart for details).  After history, exam, and medical workup I feel the patient has been appropriately medically screened and is safe for discharge home. Pertinent diagnoses were discussed with the patient. Patient was given return precautions      Tarrah Furuta, MD 01/12/17 0010

## 2017-01-11 NOTE — ED Triage Notes (Signed)
Pt from home with intermittent chest pain that yesterday pt is ambulatory and no history of heart problems. No SOB, Pt A&O x4

## 2018-10-30 ENCOUNTER — Other Ambulatory Visit: Payer: Self-pay

## 2018-10-30 DIAGNOSIS — Z20822 Contact with and (suspected) exposure to covid-19: Secondary | ICD-10-CM

## 2018-10-31 LAB — NOVEL CORONAVIRUS, NAA: SARS-CoV-2, NAA: NOT DETECTED

## 2019-02-19 ENCOUNTER — Ambulatory Visit: Payer: Managed Care, Other (non HMO) | Attending: Internal Medicine

## 2019-02-19 ENCOUNTER — Other Ambulatory Visit: Payer: Self-pay

## 2019-02-19 DIAGNOSIS — Z20822 Contact with and (suspected) exposure to covid-19: Secondary | ICD-10-CM

## 2019-02-20 LAB — NOVEL CORONAVIRUS, NAA: SARS-CoV-2, NAA: NOT DETECTED

## 2019-03-02 ENCOUNTER — Other Ambulatory Visit: Payer: Self-pay | Admitting: Podiatry

## 2019-03-02 ENCOUNTER — Other Ambulatory Visit: Payer: Self-pay

## 2019-03-02 ENCOUNTER — Ambulatory Visit (INDEPENDENT_AMBULATORY_CARE_PROVIDER_SITE_OTHER): Payer: Managed Care, Other (non HMO)

## 2019-03-02 ENCOUNTER — Ambulatory Visit: Payer: Managed Care, Other (non HMO) | Admitting: Podiatry

## 2019-03-02 DIAGNOSIS — M79672 Pain in left foot: Secondary | ICD-10-CM

## 2019-03-02 DIAGNOSIS — M205X2 Other deformities of toe(s) (acquired), left foot: Secondary | ICD-10-CM

## 2019-03-02 DIAGNOSIS — M2042 Other hammer toe(s) (acquired), left foot: Secondary | ICD-10-CM

## 2019-03-02 NOTE — Patient Instructions (Signed)
Pre-Operative Instructions  Congratulations, you have decided to take an important step towards improving your quality of life.  You can be assured that the doctors and staff at [facility] will be with you every step of the way.  Here are some important things you should know:  1. Plan to be at the surgery center/hospital at least 1 (one) hour prior to your scheduled time, unless otherwise directed by the surgical center/hospital staff.  You must have a responsible adult accompany you, remain during the surgery and drive you home.  Make sure you have directions to the surgical center/hospital to ensure you arrive on time. 2. If you are having surgery at [facility] or [facility], you will need a copy of your medical history and physical form from your family physician within one month prior to the date of surgery. We will give you a form for your primary physician to complete.  3. We make every effort to accommodate the date you request for surgery.  However, there are times where surgery dates or times have to be moved.  We will contact you as soon as possible if a change in schedule is required.   4. No aspirin/ibuprofen for one week before surgery.  If you are on aspirin, any non-steroidal anti-inflammatory medications (Mobic, Aleve, Ibuprofen) should not be taken seven (7) days prior to your surgery.  You make take Tylenol for pain prior to surgery.  5. Medications - If you are taking daily heart and blood pressure medications, seizure, reflux, allergy, asthma, anxiety, pain or diabetes medications, make sure you notify the surgery center/hospital before the day of surgery so they can tell you which medications you should take or avoid the day of surgery. 6. No food or drink after midnight the night before surgery unless directed otherwise by surgical center/hospital staff. 7. No alcoholic beverages 24-hours prior to surgery.  No smoking 24-hours prior or 24-hours after  surgery. 8. Wear loose pants or shorts. They should be loose enough to fit over bandages, boots, and casts. 9. Don't wear slip-on shoes. Sneakers are preferred. 10. Bring your boot with you to the surgery center/hospital.  Also bring crutches or a walker if your physician has prescribed it for you.  If you do not have this equipment, it will be provided for you after surgery. 11. If you have not been contacted by the surgery center/hospital by the day before your surgery, call to confirm the date and time of your surgery. 12. Leave-time from work may vary depending on the type of surgery you have.  Appropriate arrangements should be made prior to surgery with your employer. 13. Prescriptions will be provided immediately following surgery by your doctor.  Fill these as soon as possible after surgery and take the medication as directed. Pain medications will not be refilled on weekends and must be approved by the doctor. 14. Remove nail polish on the operative foot and avoid getting pedicures prior to surgery. 15. Wash the night before surgery.  The night before surgery wash the foot and leg well with water and the antibacterial soap provided. Be sure to pay special attention to beneath the toenails and in between the toes.  Wash for at least three (3) minutes. Rinse thoroughly with water and dry well with a towel.  Perform this wash unless told not to do so by your physician.  Enclosed: 1 Ice pack (please put in freezer the night before surgery)   1 Hibiclens skin cleaner     Pre-op instructions  If you have any questions regarding the instructions, please do not hesitate to call our office.  [place]: [address], [place], [place] [address] -- [phone]  [place]: [address] [place], [place] [address] -- [phone]  [place]: [address], [place], [place] [address] -- [phone]   Website: [email or web address] 

## 2019-03-06 ENCOUNTER — Encounter: Payer: Self-pay | Admitting: Podiatry

## 2019-03-06 NOTE — Progress Notes (Signed)
Subjective:  Patient ID: Melissa [name], female    DOB: [birth year],  MRN: [id number]  Chief Complaint  Patient presents with  . Hammer Toe    pt is here for a possible hammertoe of the left 2nd toe, pain has been going on for about 1 year, pt also states that pain is elevated when walking or standing. pt states that she feels numbness as well. Pt also has pain on the left plantar forefoot.    59 y.o. female presents with the above complaint.  Patient presents with painful hammertoe contractures of left 2 through 5.  Patient states that this has been going on for a long period of time.  They have progressively gotten worse and they are hurting her on top of the foot.  She has tried offloading device and various protectors to help alleviate the pain but none of that has really helped.  She has tried shoe gear modification which has not helped either.  She describes the pain as shooting pain her pain scale is 2 out of 10 right now but progressively worsens as she has been weightbearing for a while.  She denies any other acute complaints she would like to know if this could be corrected to help alleviate the pain given that she has failed all conservative therapy.   Review of Systems: Negative except as noted in the HPI. Denies N/V/F/Ch.  Past Medical History:  Diagnosis Date  . Frequency of urination   . History of basal cell carcinoma excision    multiple excision from chest area  . Hypertension   . Pelvic relaxation     Current Outpatient Medications:  .  acetaminophen (TYLENOL) 325 MG tablet, Take 650 mg by mouth every 6 (six) hours as needed for moderate pain., Disp: , Rfl:  .  aspirin 81 MG tablet, Take 81 mg by mouth daily.  , Disp: , Rfl:  .  Baloxavir Marboxil,80 MG Dose, 2 x 40 MG TBPK, Xofluza 40 mg tablet  Take 2 tablets by oral route., Disp: , Rfl:  .  BIOTIN PO, Take 1 tablet by mouth daily., Disp: , Rfl:  .  Cholecalciferol (VITAMIN D3) 1000 units CAPS, Take 1 capsule by  mouth daily. , Disp: , Rfl:  .  ibandronate (BONIVA) 150 MG tablet, ibandronate 150 mg tablet  TAKE 1 TABLET BY MOUTH ONCE EVERY 30 DAYS, Disp: , Rfl:  .  ibuprofen (ADVIL,MOTRIN) 200 MG tablet, Take 400 mg by mouth every 6 (six) hours as needed for moderate pain., Disp: , Rfl:  .  influenza vac split quadrivalent PF (FLUZONE QUADRIVALENT) 0.5 ML injection, Fluzone Quad 2020-2021 (PF) 60 mcg (15 mcg x 4)/0.5 mL IM syringe  PHARMACY ADMINISTERED, Disp: , Rfl:  .  lisinopril (PRINIVIL,ZESTRIL) 20 MG tablet, Take 20 mg by mouth daily., Disp: , Rfl:  .  oxyCODONE-acetaminophen (PERCOCET) 7.5-325 MG tablet, Take 1 tablet by mouth every 4 (four) hours as needed for severe pain., Disp: 30 tablet, Rfl: 0 .  PARoxetine (PAXIL) 10 MG tablet, Take 10 mg by mouth every morning., Disp: , Rfl:  .  Probiotic Product (PROBIOTIC DAILY) CAPS, Take 1 capsule by mouth daily as needed (upset stomach). , Disp: , Rfl:  .  vitamin C (ASCORBIC ACID) 500 MG tablet, Take 1,000 mg by mouth daily., Disp: , Rfl:   Social History   Tobacco Use  Smoking Status Never Smoker  Smokeless Tobacco Never Used    No Known Allergies Objective:  There were no vitals  filed for this visit. There is no height or weight on file to calculate BMI. Constitutional Well developed. Well nourished.  Vascular Dorsalis pedis pulses palpable bilaterally. Posterior tibial pulses palpable bilaterally. Capillary refill normal to all digits.  No cyanosis or clubbing noted. Pedal hair growth normal.  Neurologic Normal speech. Oriented to person, place, and time. Epicritic sensation to light touch grossly present bilaterally.  Dermatologic Nails well groomed and normal in appearance. No open wounds. No skin lesions.  Orthopedic:  Semiflexible/reducible hammertoe contractures of digits 2 through 4 noted.  Metatarsophalangeal joint contracture noted at the second moderate in nature.  However mild contracture noted at the third fourth  metatarsophalangeal joint.  Hammertoe contracture semireducible in nature of the fifth digit with adductovarus component noted of the left fifth digit.  Pain on palpation to all of the digits 2 through 4.  No bunion deformity noted.   Radiographs: 3 views of skeletally mature adult foot: Hammertoe contracture of digits 2 through 5 noted.  Mild adductovarus component of the fifth digit noted.  No bunion deformity noted.  Good sesamoid position noted.  Heel spur noted.  No other bony abnormalities identified. Assessment:   1. Foot pain, left   2. Hammertoe of left foot   3. Toe contracture, left   4. Adductovarus rotation of toe, acquired, left    Plan:  Patient was evaluated and treated and all questions answered.  Left foot hammertoe contractures digits 2 through 5 -I explained to the patient the etiology of hammertoe contractures and various treatment options associated with that including offloading padding shoe gear modification.  Patient has tried all conservative therapy and has failed, patient wishes to undergo surgical intervention to help decrease her pain as well as correct the hammertoe deformity of the left foot.  I explained to the patient that my primary goal is to reduce the pain and therefore allow her to ambulate normally without changes in her gait.  Patient states understanding with this plan.  I explained to the patient in depth of my surgical planning as well as my postop protocol.  Patient agrees with the plan and would like to proceed with the surgery.  I plan on performing arthroplasty of digits 2 through 5 with a possible capsulotomy of the metatarsophalangeal joint 2 through 4.  I plan on fixating the hammertoes 2 through 5 if needed with a K wire.  Patient agrees with this plan -Informed surgical risk consent was reviewed and read aloud to the patient.  I reviewed the films.  I have discussed my findings with the patient in great detail.  I have discussed all risks including  but not limited to infection, stiffness, scarring, limp, disability, deformity, damage to blood vessels and nerves, numbness, poor healing, need for braces, arthritis, chronic pain, amputation, death.  All benefits and realistic expectations discussed in great detail.  I have made no promises as to the outcome.  I have provided realistic expectations.  I have offered the patient a 2nd opinion, which they have declined and assured me they preferred to proceed despite the risks -Patient will be in a cam boot after surgery for appropriate immobilization with protection of the pins.   No follow-ups on file.

## 2019-03-20 ENCOUNTER — Telehealth: Payer: Self-pay

## 2019-03-20 NOTE — Telephone Encounter (Signed)
DOS 03/26/2019 HAMMER TOE REPAIR 2-5 LT- 03491 CAPSULOTOMY, MPJ RELEASE JONT 2-5 LT - 28270  [employer] EFFECTIVE DATE - 01/02/2016  PLAN DEDUCTIBLE - $250.00 - $0.00 REMAINING  OUT OF POCKET - $3,000.00 - $2070 REMAINING  COPAY $0.00  CO-INSURANCE 20%  PER [employer] AUTOMATED SYSTEM PROCEDURE CODES 79150 & 28285 DO NOT REQUIRE PRECERT  CALL REFERENCE # - [id number]

## 2019-03-26 ENCOUNTER — Telehealth: Payer: Self-pay | Admitting: Podiatry

## 2019-03-26 DIAGNOSIS — M2042 Other hammer toe(s) (acquired), left foot: Secondary | ICD-10-CM

## 2019-03-26 MED ORDER — IBUPROFEN 800 MG PO TABS: 800.0000 mg | ORAL_TABLET | Freq: Four times a day (QID) | ORAL | 1 refills | Status: DC | PRN

## 2019-03-26 MED ORDER — IBUPROFEN 800 MG PO TABS: 400.0000 mg | ORAL_TABLET | Freq: Four times a day (QID) | ORAL | 1 refills | Status: DC | PRN

## 2019-03-26 MED ORDER — OXYCODONE-ACETAMINOPHEN 7.5-325 MG PO TABS: 1.0000 | ORAL_TABLET | ORAL | 0 refills | Status: DC | PRN

## 2019-03-26 MED ORDER — OXYCODONE-ACETAMINOPHEN 10-325 MG PO TABS: 1.0000 | ORAL_TABLET | Freq: Four times a day (QID) | ORAL | 0 refills | Status: AC | PRN

## 2019-03-26 NOTE — Addendum Note (Signed)
Addended by: Nicholes Rough on: 03/26/2019 11:25 AM   Modules accepted: Orders

## 2019-03-26 NOTE — Addendum Note (Signed)
Addended by: Nicholes Rough on: 03/26/2019 08:23 AM   Modules accepted: Orders

## 2019-03-26 NOTE — Telephone Encounter (Signed)
I resent it to [facility] on [address]

## 2019-03-26 NOTE — Telephone Encounter (Signed)
Pt had surgery today and her Oxycodone did not get sent to her pharmacy electronically. Pt calling to let the doctor know and have it sent in.

## 2019-03-28 ENCOUNTER — Telehealth: Payer: Self-pay | Admitting: *Deleted

## 2019-03-28 NOTE — Telephone Encounter (Signed)
I spoke with pt and she states she is having a throbbing pain and has worsening pain when walking. I told pt the throbbing pain she was experiencing was typical of a too snug ace wrap, the ace is put on after surgery to staunch any post op bleeding and to hold out swelling and sometimes after it has done the job it begins to feel too tight. I instructed pt to be seated, remove the surgery boot, open-ended sock, ace wrap and to level the gauze dressing in place, then elevate the foot for 15 minutes, but if the pain worsened then dangle for 15 minutes this being the only time it is okay to dangle, after the 15 minutes either way, then place foot level with the hip and rewrap the ace beginning at the toes rolling loosely down the foot and up the leg, this would continue to apply some compression and keep the dressing in place, reapply the sock and boot. I told pt that she should be in the boot at all times even to sleep, but could remove to rest. I told pt that she could also use ibuprofen in between the doses of the percocet and she stated she is taking 400mg  Ibuprofen in between the doses of percocet. Pt states understanding and asked if she could begin to work from home tomorrow, and I told her we did not suggest that if she was still on the narcotic pain medication. Pt states understanding.

## 2019-03-28 NOTE — Telephone Encounter (Signed)
Pt states she has surgery 03/26/2019 for left foot hammer toes and wanted to know if she should be experiencing a lot of pain.

## 2019-04-04 ENCOUNTER — Ambulatory Visit (INDEPENDENT_AMBULATORY_CARE_PROVIDER_SITE_OTHER): Payer: 59 | Admitting: Podiatry

## 2019-04-04 ENCOUNTER — Ambulatory Visit (INDEPENDENT_AMBULATORY_CARE_PROVIDER_SITE_OTHER): Payer: 59

## 2019-04-04 ENCOUNTER — Other Ambulatory Visit: Payer: Self-pay

## 2019-04-04 VITALS — Temp 96.8°F

## 2019-04-04 DIAGNOSIS — Z9889 Other specified postprocedural states: Secondary | ICD-10-CM

## 2019-04-04 DIAGNOSIS — M2042 Other hammer toe(s) (acquired), left foot: Secondary | ICD-10-CM | POA: Diagnosis not present

## 2019-04-04 DIAGNOSIS — M205X2 Other deformities of toe(s) (acquired), left foot: Secondary | ICD-10-CM

## 2019-04-05 ENCOUNTER — Encounter: Payer: Self-pay | Admitting: Podiatry

## 2019-04-05 NOTE — Progress Notes (Signed)
Subjective:  Patient ID: Melissa [name], female    DOB: [birth year],  MRN: [id number]  Chief Complaint  Patient presents with  . Routine Post Op    POV#1 DOS 2.22.21 HAMMERTOE 2-5 LT, CAPSULOTOMY MPJ RELEASE JOINT 2-5 LT. Pt states she is concerned her boot is too tight on the top of her foot, and her toes are poking out. Pt denies fever/nausea/vomiting/chills. Pt states her toes still have some numbness.     59 y.o. female returns for post-op check.  Patient states overall she is doing pretty well.  Patient is concerned that her boot was rubbing up on top of the foot or if the toes were hanging off of the boot.  She would like to know if she needs to get into a bigger boot.  Also she has a complaint that her pain Fall off one of the pens.  She denies any other symptoms.  Her pain is well controlled on pain medication.  Overall patient is in good spirits.  Review of Systems: Negative except as noted in the HPI. Denies N/V/F/Ch.  Past Medical History:  Diagnosis Date  . Frequency of urination   . History of basal cell carcinoma excision    multiple excision from chest area  . Hypertension   . Pelvic relaxation     Current Outpatient Medications:  .  acetaminophen (TYLENOL) 325 MG tablet, Take 650 mg by mouth every 6 (six) hours as needed for moderate pain., Disp: , Rfl:  .  aspirin 81 MG tablet, Take 81 mg by mouth daily.  , Disp: , Rfl:  .  Baloxavir Marboxil,80 MG Dose, 2 x 40 MG TBPK, Xofluza 40 mg tablet  Take 2 tablets by oral route., Disp: , Rfl:  .  BIOTIN PO, Take 1 tablet by mouth daily., Disp: , Rfl:  .  Cholecalciferol (VITAMIN D3) 1000 units CAPS, Take 1 capsule by mouth daily. , Disp: , Rfl:  .  ibandronate (BONIVA) 150 MG tablet, ibandronate 150 mg tablet  TAKE 1 TABLET BY MOUTH ONCE EVERY 30 DAYS, Disp: , Rfl:  .  ibuprofen (ADVIL) 800 MG tablet, Take 0.5 tablets (400 mg total) by mouth every 6 (six) hours as needed for moderate pain., Disp: 30 tablet, Rfl: 1 .   ibuprofen (ADVIL) 800 MG tablet, Take 1 tablet (800 mg total) by mouth every 6 (six) hours as needed., Disp: 60 tablet, Rfl: 1 .  influenza vac split quadrivalent PF (FLUZONE QUADRIVALENT) 0.5 ML injection, Fluzone Quad 2020-2021 (PF) 60 mcg (15 mcg x 4)/0.5 mL IM syringe  PHARMACY ADMINISTERED, Disp: , Rfl:  .  lisinopril (PRINIVIL,ZESTRIL) 20 MG tablet, Take 20 mg by mouth daily., Disp: , Rfl:  .  oxyCODONE-acetaminophen (PERCOCET) 7.5-325 MG tablet, Take 1 tablet by mouth every 4 (four) hours as needed for severe pain., Disp: 30 tablet, Rfl: 0 .  PARoxetine (PAXIL) 10 MG tablet, Take 10 mg by mouth every morning., Disp: , Rfl:  .  Probiotic Product (PROBIOTIC DAILY) CAPS, Take 1 capsule by mouth daily as needed (upset stomach). , Disp: , Rfl:  .  vitamin C (ASCORBIC ACID) 500 MG tablet, Take 1,000 mg by mouth daily., Disp: , Rfl:   Social History   Tobacco Use  Smoking Status Never Smoker  Smokeless Tobacco Never Used    No Known Allergies Objective:   Vitals:   04/04/19 0858  Temp: (!) 96.8 F (36 C)   There is no height or weight on file to calculate BMI. Constitutional  Well developed. Well nourished.  Vascular Foot warm and well perfused. Capillary refill normal to all digits.   Neurologic Normal speech. Oriented to person, place, and time. Epicritic sensation to light touch grossly present bilaterally.  Dermatologic Skin healing well without signs of infection. Skin edges well coapted without signs of infection.  Orthopedic: Tenderness to palpation noted about the surgical site.   Radiographs: 2 views of skeletally mature adult foot: The pins are intact without any sign of bending or breaking.  Good correction and alignment noted.  No loss of correction noted Assessment:   1. Hammertoe of left foot   2. S/P foot surgery   3. Toe contracture, left    Plan:  Patient was evaluated and treated and all questions answered.  S/p foot surgery left -Progressing as expected  post-operatively. -XR: See above -WB Status: Weightbearing as tolerated in cam boot -Sutures: Intact without any clinical signs of dehiscence.  I will plan on removing the sutures during next visit ready -Medications: None -Foot redressed.  No follow-ups on file.

## 2019-04-11 ENCOUNTER — Other Ambulatory Visit: Payer: Self-pay

## 2019-04-11 ENCOUNTER — Ambulatory Visit (INDEPENDENT_AMBULATORY_CARE_PROVIDER_SITE_OTHER): Payer: 59 | Admitting: Podiatry

## 2019-04-11 DIAGNOSIS — M2042 Other hammer toe(s) (acquired), left foot: Secondary | ICD-10-CM

## 2019-04-11 DIAGNOSIS — M205X2 Other deformities of toe(s) (acquired), left foot: Secondary | ICD-10-CM

## 2019-04-11 DIAGNOSIS — Z9889 Other specified postprocedural states: Secondary | ICD-10-CM | POA: Diagnosis not present

## 2019-04-12 ENCOUNTER — Encounter: Payer: Self-pay | Admitting: Podiatry

## 2019-04-12 NOTE — Progress Notes (Signed)
Subjective:  Patient ID: Melissa [name], female    DOB: [birth year],  MRN: [id number]  Chief Complaint  Patient presents with  . Routine Post Op     POV #2 DOS 03/26/19 HAMMER TOE 2-5 LT, CAPSULOTOMY MPJ RELEASE JOINT 2-5 LT    59 y.o. female returns for post-op check.  Patient states overall she is doing pretty well.  She denies any other acute complaints.  She states that there is some cramping associated with the toes at nighttime but overall doing much better.  She has been taking Tylenol.  She has not been taking any other pain medication.  Review of Systems: Negative except as noted in the HPI. Denies N/V/F/Ch.  Past Medical History:  Diagnosis Date  . Frequency of urination   . History of basal cell carcinoma excision    multiple excision from chest area  . Hypertension   . Pelvic relaxation     Current Outpatient Medications:  .  acetaminophen (TYLENOL) 325 MG tablet, Take 650 mg by mouth every 6 (six) hours as needed for moderate pain., Disp: , Rfl:  .  aspirin 81 MG tablet, Take 81 mg by mouth daily.  , Disp: , Rfl:  .  Baloxavir Marboxil,80 MG Dose, 2 x 40 MG TBPK, Xofluza 40 mg tablet  Take 2 tablets by oral route., Disp: , Rfl:  .  BIOTIN PO, Take 1 tablet by mouth daily., Disp: , Rfl:  .  Cholecalciferol (VITAMIN D3) 1000 units CAPS, Take 1 capsule by mouth daily. , Disp: , Rfl:  .  ibandronate (BONIVA) 150 MG tablet, ibandronate 150 mg tablet  TAKE 1 TABLET BY MOUTH ONCE EVERY 30 DAYS, Disp: , Rfl:  .  ibuprofen (ADVIL) 800 MG tablet, Take 0.5 tablets (400 mg total) by mouth every 6 (six) hours as needed for moderate pain., Disp: 30 tablet, Rfl: 1 .  ibuprofen (ADVIL) 800 MG tablet, Take 1 tablet (800 mg total) by mouth every 6 (six) hours as needed., Disp: 60 tablet, Rfl: 1 .  influenza vac split quadrivalent PF (FLUZONE QUADRIVALENT) 0.5 ML injection, Fluzone Quad 2020-2021 (PF) 60 mcg (15 mcg x 4)/0.5 mL IM syringe  PHARMACY ADMINISTERED, Disp: , Rfl:  .   lisinopril (PRINIVIL,ZESTRIL) 20 MG tablet, Take 20 mg by mouth daily., Disp: , Rfl:  .  oxyCODONE-acetaminophen (PERCOCET) 7.5-325 MG tablet, Take 1 tablet by mouth every 4 (four) hours as needed for severe pain., Disp: 30 tablet, Rfl: 0 .  PARoxetine (PAXIL) 10 MG tablet, Take 10 mg by mouth every morning., Disp: , Rfl:  .  Probiotic Product (PROBIOTIC DAILY) CAPS, Take 1 capsule by mouth daily as needed (upset stomach). , Disp: , Rfl:  .  vitamin C (ASCORBIC ACID) 500 MG tablet, Take 1,000 mg by mouth daily., Disp: , Rfl:   Social History   Tobacco Use  Smoking Status Never Smoker  Smokeless Tobacco Never Used    No Known Allergies Objective:   There were no vitals filed for this visit. There is no height or weight on file to calculate BMI. Constitutional Well developed. Well nourished.  Vascular Foot warm and well perfused. Capillary refill normal to all digits.   Neurologic Normal speech. Oriented to person, place, and time. Epicritic sensation to light touch grossly present bilaterally.  Dermatologic Skin healing well without signs of infection. Skin edges well coapted without signs of infection.  Orthopedic: Tenderness to palpation noted about the surgical site.   Radiographs: None  Assessment:   1.  Hammertoe of left foot   2. S/P foot surgery   3. Toe contracture, left    Plan:  Patient was evaluated and treated and all questions answered.  S/p foot surgery left -Progressing as expected post-operatively. -XR: See above -WB Status: Weightbearing as tolerated in surgical shoe -Sutures: Intact without any clinical signs of dehiscence.  Stitches are not ready to come out.  I will plan on removing them during next visit-Medications: None -Foot redressed. -Patient has started transitioning to surgical shoe.  Surgical shoe was dispensed  No follow-ups on file.

## 2019-04-25 ENCOUNTER — Ambulatory Visit (INDEPENDENT_AMBULATORY_CARE_PROVIDER_SITE_OTHER): Payer: 59 | Admitting: Podiatry

## 2019-04-25 ENCOUNTER — Encounter: Payer: Self-pay | Admitting: Podiatry

## 2019-04-25 ENCOUNTER — Ambulatory Visit: Payer: 59

## 2019-04-25 ENCOUNTER — Other Ambulatory Visit: Payer: Self-pay

## 2019-04-25 DIAGNOSIS — Z9889 Other specified postprocedural states: Secondary | ICD-10-CM

## 2019-04-25 DIAGNOSIS — M2042 Other hammer toe(s) (acquired), left foot: Secondary | ICD-10-CM

## 2019-04-25 NOTE — Progress Notes (Signed)
Subjective:  Patient ID: Melissa [name], female    DOB: 09/08/1960,  MRN: [id number]  Chief Complaint  Patient presents with  . Routine Post Op    POV #3 DOS 03/26/19 HAMMER TOE 2-5 LT, CAPSULOTOMY MPJ RELEASE JOINT 2-5 LT    59 y.o. female returns for post-op check.  Patient is doing well overall.  She does not have any pain.  Her stitches were removed today.  She denies any other acute complaints.  She has been walking with her surgical shoe.  Pins are intact.  The dressings have been clean dry and intact.  Review of Systems: Negative except as noted in the HPI. Denies N/V/F/Ch.  Past Medical History:  Diagnosis Date  . Frequency of urination   . History of basal cell carcinoma excision    multiple excision from chest area  . Hypertension   . Pelvic relaxation     Current Outpatient Medications:  .  acetaminophen (TYLENOL) 325 MG tablet, Take 650 mg by mouth every 6 (six) hours as needed for moderate pain., Disp: , Rfl:  .  aspirin 81 MG tablet, Take 81 mg by mouth daily.  , Disp: , Rfl:  .  Baloxavir Marboxil,80 MG Dose, 2 x 40 MG TBPK, Xofluza 40 mg tablet  Take 2 tablets by oral route., Disp: , Rfl:  .  BIOTIN PO, Take 1 tablet by mouth daily., Disp: , Rfl:  .  Cholecalciferol (VITAMIN D3) 1000 units CAPS, Take 1 capsule by mouth daily. , Disp: , Rfl:  .  ibandronate (BONIVA) 150 MG tablet, ibandronate 150 mg tablet  TAKE 1 TABLET BY MOUTH ONCE EVERY 30 DAYS, Disp: , Rfl:  .  ibuprofen (ADVIL) 800 MG tablet, Take 0.5 tablets (400 mg total) by mouth every 6 (six) hours as needed for moderate pain., Disp: 30 tablet, Rfl: 1 .  ibuprofen (ADVIL) 800 MG tablet, Take 1 tablet (800 mg total) by mouth every 6 (six) hours as needed., Disp: 60 tablet, Rfl: 1 .  influenza vac split quadrivalent PF (FLUZONE QUADRIVALENT) 0.5 ML injection, Fluzone Quad 2020-2021 (PF) 60 mcg (15 mcg x 4)/0.5 mL IM syringe  PHARMACY ADMINISTERED, Disp: , Rfl:  .  lisinopril (PRINIVIL,ZESTRIL) 20 MG  tablet, Take 20 mg by mouth daily., Disp: , Rfl:  .  oxyCODONE-acetaminophen (PERCOCET) 7.5-325 MG tablet, Take 1 tablet by mouth every 4 (four) hours as needed for severe pain., Disp: 30 tablet, Rfl: 0 .  PARoxetine (PAXIL) 10 MG tablet, Take 10 mg by mouth every morning., Disp: , Rfl:  .  Probiotic Product (PROBIOTIC DAILY) CAPS, Take 1 capsule by mouth daily as needed (upset stomach). , Disp: , Rfl:  .  vitamin C (ASCORBIC ACID) 500 MG tablet, Take 1,000 mg by mouth daily., Disp: , Rfl:   Social History   Tobacco Use  Smoking Status Never Smoker  Smokeless Tobacco Never Used    No Known Allergies Objective:  There were no vitals filed for this visit. There is no height or weight on file to calculate BMI. Constitutional Well developed. Well nourished.  Vascular Foot warm and well perfused. Capillary refill normal to all digits.   Neurologic Normal speech. Oriented to person, place, and time. Epicritic sensation to light touch grossly present bilaterally.  Dermatologic Skin healing well without signs of infection. Skin edges well coapted without signs of infection.  Orthopedic:  No tenderness to palpation noted about the surgical site.   Radiographs: Unable to obtain as the x-ray machine is down  Assessment:   1. Hammertoe of left foot   2. S/P foot surgery    Plan:  Patient was evaluated and treated and all questions answered.  S/p foot surgery left -Progressing as expected post-operatively. -XR: Unable to obtain as the extra machines were down.  We will plan on obtaining x-rays during next visit -WB Status: Weightbearing as tolerated in surgical shoe -Sutures: Sutures were taken in office today.  No dehiscence noted.  Skin well coapted.  No clinical signs of infection noted. -Medications: None -Ace bandage was applied.  The pins are in good position and alignment.  No loosening noted.  I will plan on removing the pins in 2 weeks.  No follow-ups on file.

## 2019-04-26 ENCOUNTER — Ambulatory Visit: Payer: Self-pay | Attending: Internal Medicine

## 2019-04-26 DIAGNOSIS — Z23 Encounter for immunization: Secondary | ICD-10-CM

## 2019-04-26 NOTE — Progress Notes (Signed)
   Covid-19 Vaccination Clinic  Name:  Melissa [name]    MRN: [id number] DOB: [birth year]  04/26/2019  [name] was observed post Covid-19 immunization for 15 minutes without incident. She was provided with Vaccine Information Sheet and instruction to access the V-Safe system.   [name] was instructed to call 911 with any severe reactions post vaccine: Marland Kitchen Difficulty breathing  . Swelling of face and throat  . A fast heartbeat  . A bad rash all over body  . Dizziness and weakness   Immunizations Administered    Name Date Dose VIS Date Route   Pfizer COVID-19 Vaccine 04/26/2019  3:23 PM 0.3 mL 01/12/2019 Intramuscular   Manufacturer: [employer], [employer]   Lot: [id number]   NDC: [address]-4

## 2019-05-11 ENCOUNTER — Ambulatory Visit (INDEPENDENT_AMBULATORY_CARE_PROVIDER_SITE_OTHER): Payer: 59 | Admitting: Podiatry

## 2019-05-11 ENCOUNTER — Encounter: Payer: Self-pay | Admitting: Podiatry

## 2019-05-11 ENCOUNTER — Other Ambulatory Visit: Payer: Self-pay

## 2019-05-11 DIAGNOSIS — M205X2 Other deformities of toe(s) (acquired), left foot: Secondary | ICD-10-CM

## 2019-05-11 DIAGNOSIS — Z9889 Other specified postprocedural states: Secondary | ICD-10-CM

## 2019-05-11 DIAGNOSIS — M2042 Other hammer toe(s) (acquired), left foot: Secondary | ICD-10-CM

## 2019-05-11 NOTE — Progress Notes (Signed)
Subjective:  Patient ID: Melissa [name], female    DOB: [birth year],  MRN: [id number]  Chief Complaint  Patient presents with  . Routine Post Op    POV #4 DOS 03/26/19 HAMMER TOE 2-5 LT, CAPSULOTOMY MPJ RELEASE JOINT 2-5 LT, pt is doing alot better, pt is looking to know when to take a shower, pt is also ready for a pin removal as well.    59 y.o. female returns for post-op check.  Patient is doing well overall.  She does not have any pain.  Her stitches were removed today.  She denies any other acute complaints.  She has been walking with her surgical shoe.  Pins are intact.  The dressings have been clean dry and intact.  Review of Systems: Negative except as noted in the HPI. Denies N/V/F/Ch.  Past Medical History:  Diagnosis Date  . Frequency of urination   . History of basal cell carcinoma excision    multiple excision from chest area  . Hypertension   . Pelvic relaxation     Current Outpatient Medications:  .  acetaminophen (TYLENOL) 325 MG tablet, Take 650 mg by mouth every 6 (six) hours as needed for moderate pain., Disp: , Rfl:  .  aspirin 81 MG tablet, Take 81 mg by mouth daily.  , Disp: , Rfl:  .  Baloxavir Marboxil,80 MG Dose, 2 x 40 MG TBPK, Xofluza 40 mg tablet  Take 2 tablets by oral route., Disp: , Rfl:  .  BIOTIN PO, Take 1 tablet by mouth daily., Disp: , Rfl:  .  Cholecalciferol (VITAMIN D3) 1000 units CAPS, Take 1 capsule by mouth daily. , Disp: , Rfl:  .  ibandronate (BONIVA) 150 MG tablet, ibandronate 150 mg tablet  TAKE 1 TABLET BY MOUTH ONCE EVERY 30 DAYS, Disp: , Rfl:  .  ibuprofen (ADVIL) 800 MG tablet, Take 0.5 tablets (400 mg total) by mouth every 6 (six) hours as needed for moderate pain., Disp: 30 tablet, Rfl: 1 .  ibuprofen (ADVIL) 800 MG tablet, Take 1 tablet (800 mg total) by mouth every 6 (six) hours as needed., Disp: 60 tablet, Rfl: 1 .  influenza vac split quadrivalent PF (FLUZONE QUADRIVALENT) 0.5 ML injection, Fluzone Quad 2020-2021 (PF) 60 mcg  (15 mcg x 4)/0.5 mL IM syringe  PHARMACY ADMINISTERED, Disp: , Rfl:  .  lisinopril (PRINIVIL,ZESTRIL) 20 MG tablet, Take 20 mg by mouth daily., Disp: , Rfl:  .  oxyCODONE-acetaminophen (PERCOCET) 7.5-325 MG tablet, Take 1 tablet by mouth every 4 (four) hours as needed for severe pain., Disp: 30 tablet, Rfl: 0 .  PARoxetine (PAXIL) 10 MG tablet, Take 10 mg by mouth every morning., Disp: , Rfl:  .  Probiotic Product (PROBIOTIC DAILY) CAPS, Take 1 capsule by mouth daily as needed (upset stomach). , Disp: , Rfl:  .  vitamin C (ASCORBIC ACID) 500 MG tablet, Take 1,000 mg by mouth daily., Disp: , Rfl:   Social History   Tobacco Use  Smoking Status Never Smoker  Smokeless Tobacco Never Used    No Known Allergies Objective:  There were no vitals filed for this visit. There is no height or weight on file to calculate BMI. Constitutional Well developed. Well nourished.  Vascular Foot warm and well perfused. Capillary refill normal to all digits.   Neurologic Normal speech. Oriented to person, place, and time. Epicritic sensation to light touch grossly present bilaterally.  Dermatologic  skin has completely healed with reepithelialization.  Good correction and alignment noted.Marland Kitchen  Orthopedic:  No tenderness to palpation noted about the surgical site.   Radiographs: None Assessment:   1. Hammertoe of left foot   2. S/P foot surgery   3. Toe contracture, left    Plan:  Patient was evaluated and treated and all questions answered.  S/p foot surgery left -Progressing as expected post-operatively. -XR: None -WB Status: Weightbearing as tolerated in surgical shoe -Sutures none -Medications: None -Pins were removed in its entirety without any complication.  No complications noted..  Plan on taking final x-rays and for final visit in 4 weeks  Return in about 4 weeks (around 06/08/2019), or final pov with xray.Marland Kitchen

## 2019-05-21 ENCOUNTER — Ambulatory Visit: Payer: Self-pay | Attending: Internal Medicine

## 2019-05-21 DIAGNOSIS — Z23 Encounter for immunization: Secondary | ICD-10-CM

## 2019-05-21 NOTE — Progress Notes (Signed)
   Covid-19 Vaccination Clinic  Name:  Melissa [name]    MRN: [id number] DOB: 09/21/1960  05/21/2019  Melissa [name] was observed post Covid-19 immunization for 15 minutes without incident. She was provided with Vaccine Information Sheet and instruction to access the V-Safe system.   Melissa [name] was instructed to call 911 with any severe reactions post vaccine: Marland Kitchen Difficulty breathing  . Swelling of face and throat  . A fast heartbeat  . A bad rash all over body  . Dizziness and weakness   Immunizations Administered    Name Date Dose VIS Date Route   Pfizer COVID-19 Vaccine 05/21/2019  3:16 PM 0.3 mL 03/28/2018 Intramuscular   Manufacturer: [employer], [employer]   Lot: [id number]   NDC: [address]-5

## 2019-06-08 ENCOUNTER — Other Ambulatory Visit: Payer: Self-pay

## 2019-06-08 ENCOUNTER — Encounter: Payer: Self-pay | Admitting: Podiatry

## 2019-06-08 ENCOUNTER — Ambulatory Visit (INDEPENDENT_AMBULATORY_CARE_PROVIDER_SITE_OTHER): Payer: 59

## 2019-06-08 ENCOUNTER — Ambulatory Visit (INDEPENDENT_AMBULATORY_CARE_PROVIDER_SITE_OTHER): Payer: 59 | Admitting: Podiatry

## 2019-06-08 VITALS — Temp 96.7°F

## 2019-06-08 DIAGNOSIS — M2042 Other hammer toe(s) (acquired), left foot: Secondary | ICD-10-CM

## 2019-06-08 DIAGNOSIS — Z9889 Other specified postprocedural states: Secondary | ICD-10-CM

## 2019-06-08 NOTE — Progress Notes (Signed)
Subjective:  Patient ID: Melissa [name], female    DOB: [birth year],  MRN: [id number]  Chief Complaint  Patient presents with  . Routine Post Op    DOS 03/26/2019. L. Pt stated, "Tolerable, constant aching - about 3/10. Still have swelling. 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    59 y.o. female returns for post-op check.  Patient doing really well.  She has occasional numbness and tingling and swelling associated with it.  She is constantly on her foot.  She denies any other acute complaints.  Review of Systems: Negative except as noted in the HPI. Denies N/V/F/Ch.  Past Medical History:  Diagnosis Date  . Frequency of urination   . History of basal cell carcinoma excision    multiple excision from chest area  . Hypertension   . Pelvic relaxation     Current Outpatient Medications:  .  acetaminophen (TYLENOL) 325 MG tablet, Take 650 mg by mouth every 6 (six) hours as needed for moderate pain., Disp: , Rfl:  .  aspirin 81 MG tablet, Take 81 mg by mouth daily.  , Disp: , Rfl:  .  Baloxavir Marboxil,80 MG Dose, 2 x 40 MG TBPK, Xofluza 40 mg tablet  Take 2 tablets by oral route., Disp: , Rfl:  .  BIOTIN PO, Take 1 tablet by mouth daily., Disp: , Rfl:  .  Cholecalciferol (VITAMIN D3) 1000 units CAPS, Take 1 capsule by mouth daily. , Disp: , Rfl:  .  ibandronate (BONIVA) 150 MG tablet, ibandronate 150 mg tablet  TAKE 1 TABLET BY MOUTH ONCE EVERY 30 DAYS, Disp: , Rfl:  .  ibuprofen (ADVIL) 800 MG tablet, Take 0.5 tablets (400 mg total) by mouth every 6 (six) hours as needed for moderate pain., Disp: 30 tablet, Rfl: 1 .  ibuprofen (ADVIL) 800 MG tablet, Take 1 tablet (800 mg total) by mouth every 6 (six) hours as needed., Disp: 60 tablet, Rfl: 1 .  influenza vac split quadrivalent PF (FLUZONE QUADRIVALENT) 0.5 ML injection, Fluzone Quad 2020-2021 (PF) 60 mcg (15 mcg x 4)/0.5 mL IM syringe  PHARMACY ADMINISTERED, Disp: , Rfl:  .  lisinopril (PRINIVIL,ZESTRIL)  20 MG tablet, Take 20 mg by mouth daily., Disp: , Rfl:  .  PARoxetine (PAXIL) 10 MG tablet, Take 10 mg by mouth every morning., Disp: , Rfl:  .  Probiotic Product (PROBIOTIC DAILY) CAPS, Take 1 capsule by mouth daily as needed (upset stomach). , Disp: , Rfl:  .  vitamin C (ASCORBIC ACID) 500 MG tablet, Take 1,000 mg by mouth daily., Disp: , Rfl:  .  oxyCODONE-acetaminophen (PERCOCET) 7.5-325 MG tablet, Take 1 tablet by mouth every 4 (four) hours as needed for severe pain. (Patient not taking: Reported on 06/08/2019), Disp: 30 tablet, Rfl: 0  Social History   Tobacco Use  Smoking Status Never Smoker  Smokeless Tobacco Never Used    No Known Allergies Objective:   Vitals:   06/08/19 0905  Temp: (!) 96.7 F (35.9 C)   There is no height or weight on file to calculate BMI. Constitutional Well developed. Well nourished.  Vascular Foot warm and well perfused. Capillary refill normal to all digits.   Neurologic Normal speech. Oriented to person, place, and time. Epicritic sensation to light touch grossly present bilaterally.  Dermatologic  skin completely reepithelialized.  Manual muscle testing shows 4 out of 5 for the extensor tendon to all the digits.  Good correction and alignment clinically noted  of all the digits 2 through 5.  Orthopedic:  No tenderness to palpation of the surgical site.   Radiographs: 3 views of skeletally mature adult left foot: Good correction and alignment noted.  Status post arthroplasty of the second third and fourth and fifth digit. Assessment:   1. Hammertoe of left foot   2. S/P foot surgery    Plan:  Patient was evaluated and treated and all questions answered.  S/p foot surgery left -Progressing as expected post-operatively. -XR: See above -WB Status: Weightbearing as tolerated in regular shoes -Patient is doing really well.  Clinically she has improved.  She has good range of motion with 4 out of 5 strengthening of the extensor tendon.  Return  if symptoms worsen or fail to improve.

## 2020-01-09 ENCOUNTER — Telehealth: Payer: Self-pay

## 2020-01-09 NOTE — Telephone Encounter (Signed)
NOTES ON FILE FROM JOHN [facility]CCOM[facility] [phone], SENT REFERRAL TO SCHEDULING 

## 2020-02-26 ENCOUNTER — Ambulatory Visit: Payer: 59 | Admitting: Interventional Cardiology

## 2020-02-26 ENCOUNTER — Encounter: Payer: Self-pay | Admitting: Interventional Cardiology

## 2020-02-26 ENCOUNTER — Other Ambulatory Visit: Payer: Self-pay

## 2020-02-26 VITALS — BP 140/72 | HR 66 | Ht 65.0 in | Wt 177.0 lb

## 2020-02-26 DIAGNOSIS — E782 Mixed hyperlipidemia: Secondary | ICD-10-CM | POA: Diagnosis not present

## 2020-02-26 DIAGNOSIS — Z8249 Family history of ischemic heart disease and other diseases of the circulatory system: Secondary | ICD-10-CM

## 2020-02-26 DIAGNOSIS — I10 Essential (primary) hypertension: Secondary | ICD-10-CM | POA: Diagnosis not present

## 2020-02-26 NOTE — Progress Notes (Signed)
Cardiology Office Note   Date:  02/26/2020   ID:  Tashara, Melissa [name] [birth year], MRN [id number]  PCP:  Johny Blamer, MD    No chief complaint on file.  Family h/o CAD  Wt Readings from Last 3 Encounters:  02/26/20 177 lb (80.3 kg)  01/11/17 147 lb (66.7 kg)  07/10/15 190 lb (86.2 kg)       History of Present Illness: Melissa [name] is a 60 y.o. female who is being seen today for the evaluation of family history of coronary artery disease at the request of McComb, John, MD.  The patient has several risk factors for heart disease including hypertension and hyperlipidemia.  Father had MI at age 39.  He had CABG.  Second MI at 60 and died. Older brother who had MI at 72 with CABG.  Another brother with hole in his heart- corrected in [place].  Genetic abnormality as well.    The patient has high cholesterol.  It has increased over the past 3 years.  Has not been on a cholesterol medicine.   She just had 4 discs repaired in her neck.  No cardiac problems during surgery.  2019 LDL 150, 2020 173, 2021 168  Denies : Chest pain. Dizziness. Leg edema. Nitroglycerin use. Orthopnea. Palpitations. Paroxysmal nocturnal dyspnea. Shortness of breath. Syncope.   Most strenuous activity was walking, prior to neck surgery.      Past Medical History:  Diagnosis Date  . Frequency of urination   . History of basal cell carcinoma excision    multiple excision from chest area  . Hypertension   . Pelvic relaxation     Past Surgical History:  Procedure Laterality Date  . ANTERIOR AND POSTERIOR REPAIR WITH SACROSPINOUS FIXATION N/A 07/10/2015   Procedure: ANTERIOR AND POSTERIOR REPAIR WITH SACROSPINOUS LIGAMENT SUSPENSION;  Surgeon: Richardean Chimera, MD;  Location: [facility];  Service: Gynecology;  Laterality: N/A;  NEED BED  . LAPAROSCOPIC ASSISTED VAGINAL HYSTERECTOMY  11/ 2006  . TUBAL LIGATION Bilateral    laparoscopic     Current Outpatient  Medications  Medication Sig Dispense Refill  . acetaminophen (TYLENOL) 325 MG tablet Take 650 mg by mouth every 6 (six) hours as needed for moderate pain.    Marland Kitchen aspirin 81 MG tablet Take 81 mg by mouth daily.    Marland Kitchen BIOTIN PO Take 1 tablet by mouth daily.    . Calcium Carbonate (CALCIUM 500 PO) Take 500 mg by mouth daily.    . Cholecalciferol (VITAMIN D3) 1000 units CAPS Take 1 capsule by mouth daily.     Marland Kitchen ibandronate (BONIVA) 150 MG tablet ibandronate 150 mg tablet  TAKE 1 TABLET BY MOUTH ONCE EVERY 30 DAYS    . Melatonin 10 MG CAPS Take 10 mg by mouth at bedtime.    Marland Kitchen PARoxetine (PAXIL) 10 MG tablet Take 10 mg by mouth every morning.    . vitamin C (ASCORBIC ACID) 500 MG tablet Take 1,000 mg by mouth daily.     No current facility-administered medications for this visit.    Allergies:   Patient has no known allergies.    Social History:  The patient  reports that she has never smoked. She has never used smokeless tobacco. She reports current alcohol use of about 4.0 standard drinks of alcohol per week. She reports that she does not use drugs.   Family History:  The patient's family history includes Heart disease in her brother and father.  ROS:  Please see the history of present illness.   Otherwise, review of systems are positive for recent neck surgery.   All other systems are reviewed and negative.    PHYSICAL EXAM: VS:  BP 140/72   Pulse 66   Ht 5\' 5"  (1.651 m)   Wt 177 lb (80.3 kg)   SpO2 98%   BMI 29.45 kg/m  , BMI Body mass index is 29.45 kg/m. GEN: Well nourished, well developed, in no acute distress  HEENT: wearing C-collar Neck: Limited exam due to c-collar Cardiac: RRR; no murmurs, rubs, or gallops,no edema  Respiratory:  clear to auscultation bilaterally, normal work of breathing GI: soft, nontender, nondistended, + BS MS: no deformity or atrophy  Skin: warm and dry, no rash Neuro:  Strength and sensation are intact Psych: euthymic mood, full  affect   EKG:   The ekg ordered today demonstrates NSR, no ST changes   Recent Labs: No results found for requested labs within last 8760 hours.   Lipid Panel No results found for: CHOL, TRIG, HDL, CHOLHDL, VLDL, LDLCALC, LDLDIRECT   Other studies Reviewed: Additional studies/ records that were reviewed today with results demonstrating: Labs reviewed.   ASSESSMENT AND PLAN:  1. Family h/o CAD: We discussed screening options.  We will plan for calcium scoring CT given her family history of heart disease.  I will also help to see whether or not she should be treated with a statin. 2. Hyperlpidemia: LDL > 160.  Start statin if calcium score greater than 100 or if her percentile calcium score is greater than 75th percentile.  Whole food, plant-based diet recommended.  Increase fiber intake. 3. HTN: Was an issue when her weight was up.  Now off of meds after weight loss.  She has gained some weight back.  Low-salt diet recommended.  Avoid processed foods.   Current medicines are reviewed at length with the patient today.  The patient concerns regarding her medicines were addressed.  The following changes have been made:  No change  Labs/ tests ordered today include:  No orders of the defined types were placed in this encounter.   Recommend 150 minutes/week of aerobic exercise Low fat, low carb, high fiber diet recommended  Disposition:   FU for CT scan   Signed, [place], MD  02/26/2020 3:04 PM    [facility] [address] [place], [place], [place]  [place] Phone: [phone]; Fax: [phone]

## 2020-02-26 NOTE — Patient Instructions (Signed)
Medication Instructions:  Your physician recommends that you continue on your current medications as directed. Please refer to the Current Medication list given to you today.  *If you need a refill on your cardiac medications before your next appointment, please call your pharmacy*   Lab Work: NONE If you have labs (blood work) drawn today and your tests are completely normal, you will receive your results only by: Marland Kitchen MyChart Message (if you have MyChart) OR . A paper copy in the mail If you have any lab test that is abnormal or we need to change your treatment, we will call you to review the results.   Testing/Procedures: Your provider has recommended that you have a Cardiac Calcium CT test. This is a self pay test it is not sent through insurance. Test will be completed here at [address]   Follow-Up: Follow up will be based on the results of your CT. At [facility], you and your health needs are our priority.  As part of our continuing mission to provide you with exceptional heart care, we have created designated Provider Care Teams.  These Care Teams include your primary Cardiologist (physician) and Advanced Practice Providers (APPs -  Physician Assistants and Nurse Practitioners) who all work together to provide you with the care you need, when you need it.  We recommend signing up for the patient portal called "MyChart".  Sign up information is provided on this After Visit Summary.  MyChart is used to connect with patients for Virtual Visits (Telemedicine).  Patients are able to view lab/test results, encounter notes, upcoming appointments, etc.  Non-urgent messages can be sent to your provider as well.   To learn more about what you can do with MyChart, go to [email or web address].

## 2020-03-06 ENCOUNTER — Ambulatory Visit
Admission: RE | Admit: 2020-03-06 | Discharge: 2020-03-06 | Disposition: A | Discharge status: Home / Self Care | Payer: 59 | Source: Ambulatory Visit | Attending: Interventional Cardiology | Admitting: Interventional Cardiology

## 2020-03-06 ENCOUNTER — Other Ambulatory Visit: Payer: Self-pay

## 2020-03-06 DIAGNOSIS — E782 Mixed hyperlipidemia: Secondary | ICD-10-CM

## 2020-03-06 DIAGNOSIS — Z8249 Family history of ischemic heart disease and other diseases of the circulatory system: Secondary | ICD-10-CM

## 2020-03-07 ENCOUNTER — Other Ambulatory Visit: Payer: Self-pay | Admitting: *Deleted

## 2020-03-07 DIAGNOSIS — E7801 Familial hypercholesterolemia: Secondary | ICD-10-CM

## 2020-03-07 MED ORDER — ROSUVASTATIN CALCIUM 10 MG PO TABS: 10.0000 mg | ORAL_TABLET | Freq: Every day | ORAL | 3 refills | Status: DC

## 2020-03-10 ENCOUNTER — Telehealth: Payer: Self-pay | Admitting: *Deleted

## 2020-03-10 DIAGNOSIS — E782 Mixed hyperlipidemia: Secondary | ICD-10-CM

## 2020-03-10 NOTE — Telephone Encounter (Signed)
Patient notified.  She started Crestor this past weekend.  She will come in for fasting lab work on May 4,2022

## 2020-03-10 NOTE — Telephone Encounter (Signed)
-----   Message from Corky Crafts, MD sent at 03/06/2020  5:27 PM EST ----- Since her calcium score is > 75th percentile for her age, I would start a statin and try to get LDL to < 100 given her family history.  Can start Crestor 10 mg daily.  Repeat lipids and LFTs in 3 months.

## 2020-06-04 ENCOUNTER — Other Ambulatory Visit: Payer: 59 | Admitting: *Deleted

## 2020-06-04 ENCOUNTER — Other Ambulatory Visit: Payer: Self-pay

## 2020-06-04 DIAGNOSIS — E782 Mixed hyperlipidemia: Secondary | ICD-10-CM

## 2020-06-04 LAB — LIPID PANEL
Chol/HDL Ratio: 2.4 ratio (ref 0.0–4.4)
Cholesterol, Total: 184 mg/dL (ref 100–199)
HDL: 76 mg/dL (ref >39)
LDL Chol Calc (NIH): 92 mg/dL (ref 0–99)
Triglycerides: 91 mg/dL (ref 0–149)
VLDL Cholesterol Cal: 16 mg/dL (ref 5–40)

## 2020-06-04 LAB — HEPATIC FUNCTION PANEL
ALT: 15 IU/L (ref 0–32)
AST: 20 IU/L (ref 0–40)
Albumin: 4.5 g/dL (ref 3.8–4.9)
Alkaline Phosphatase: 105 IU/L (ref 44–121)
Bilirubin Total: 0.4 mg/dL (ref 0.0–1.2)
Bilirubin, Direct: 0.13 mg/dL (ref 0.00–0.40)
Total Protein: 6.9 g/dL (ref 6.0–8.5)

## 2021-02-26 ENCOUNTER — Ambulatory Visit
Admission: RE | Admit: 2021-02-26 | Discharge: 2021-02-26 | Disposition: A | Discharge status: Home / Self Care | Payer: 59 | Source: Ambulatory Visit | Attending: Physician Assistant | Admitting: Physician Assistant

## 2021-02-26 ENCOUNTER — Other Ambulatory Visit: Payer: Self-pay | Admitting: Physician Assistant

## 2021-02-26 ENCOUNTER — Other Ambulatory Visit: Payer: Self-pay

## 2021-02-26 DIAGNOSIS — U099 Post covid-19 condition, unspecified: Secondary | ICD-10-CM

## 2021-03-02 ENCOUNTER — Other Ambulatory Visit: Payer: Self-pay | Admitting: Interventional Cardiology

## 2021-03-29 ENCOUNTER — Other Ambulatory Visit: Payer: Self-pay | Admitting: Interventional Cardiology

## 2021-05-06 ENCOUNTER — Ambulatory Visit: Payer: 59 | Admitting: Registered"

## 2021-06-27 ENCOUNTER — Other Ambulatory Visit: Payer: Self-pay | Admitting: Interventional Cardiology

## 2021-08-18 NOTE — Progress Notes (Signed)
Cardiology Office Note   Date:  08/19/2021   ID:  Melissa [name], Melissa [name] Aug 12, 1960, MRN [id number]  PCP:  Johny Blamer, MD    No chief complaint on file.  Family history of CAD  Wt Readings from Last 3 Encounters:  08/19/21 187 lb (84.8 kg)  02/26/20 177 lb (80.3 kg)  01/11/17 147 lb (66.7 kg)       History of Present Illness: Melissa [name] is a 61 y.o. female who has a family history of coronary artery disease  The patient has several risk factors for heart disease including hypertension and hyperlipidemia.   Father had MI at age 19.  He had CABG.  Second MI at 95 and died. Older brother who had MI at 77 with CABG.  Another brother with hole in his heart- corrected in [place].  Genetic abnormality as well.     The patient has high cholesterol.  It has increased over the past 3 years.  Has not been on a cholesterol medicine.    She just had 4 discs repaired in her neck.  No cardiac problems during surgery.   2019 LDL 150, 2020 173, 2021 168   Calcium score in February 2022 showed: "Coronary calcium score of 40. This was 84th percentile for age, gender, and race matched controls.   2. Mild dilation of ascending aorta on non-contrasted study (40 mm); consider dedicated imaging assessment if clinically indicated.   3.  Mild mitral annular calcification."  She has continued to exercise.  She is having difficulty losing weight.  Past Medical History:  Diagnosis Date   Frequency of urination    History of basal cell carcinoma excision    multiple excision from chest area   Hypertension    Pelvic relaxation     Past Surgical History:  Procedure Laterality Date   ANTERIOR AND POSTERIOR REPAIR WITH SACROSPINOUS FIXATION N/A 07/10/2015   Procedure: ANTERIOR AND POSTERIOR REPAIR WITH SACROSPINOUS LIGAMENT SUSPENSION;  Surgeon: Richardean Chimera, MD;  Location: [facility];  Service: Gynecology;  Laterality: N/A;  NEED BED   LAPAROSCOPIC  ASSISTED VAGINAL HYSTERECTOMY  11/ 2006   TUBAL LIGATION Bilateral    laparoscopic     Current Outpatient Medications  Medication Sig Dispense Refill   acetaminophen (TYLENOL) 325 MG tablet Take 650 mg by mouth every 6 (six) hours as needed for moderate pain.     aspirin 81 MG tablet Take 81 mg by mouth daily.     BIOTIN PO Take 1 tablet by mouth daily.     Calcium Carbonate (CALCIUM 500 PO) Take 500 mg by mouth daily.     Cholecalciferol (VITAMIN D3) 1000 units CAPS Take 1 capsule by mouth daily.      denosumab (PROLIA) 60 MG/ML SOSY injection Inject 1 mL by subcutaneous route for 180 days.     Melatonin 10 MG CAPS Take 10 mg by mouth at bedtime.     PARoxetine (PAXIL) 10 MG tablet Take 10 mg by mouth every morning.     rosuvastatin (CRESTOR) 10 MG tablet TAKE 1 TABLET BY MOUTH EVERY DAY 90 tablet 1   No current facility-administered medications for this visit.    Allergies:   Patient has no known allergies.    Social History:  The patient  reports that she has never smoked. She has never used smokeless tobacco. She reports current alcohol use of about 4.0 standard drinks of alcohol per week. She reports that she does not use drugs.  Family History:  The patient's family history includes Heart disease in her brother and father.    ROS:  Please see the history of present illness.   Otherwise, review of systems are positive for weight gain, difficulty losing weight.   All other systems are reviewed and negative.    PHYSICAL EXAM: VS:  BP 140/90 (BP Location: Left Arm, Patient Position: Sitting, Cuff Size: Normal)   Pulse (!) 57   Ht 5\' 5"  (1.651 m)   Wt 187 lb (84.8 kg)   BMI 31.12 kg/m  , BMI Body mass index is 31.12 kg/m. GEN: Well nourished, well developed, in no acute distress HEENT: normal Neck: no JVD, carotid bruits, or masses Cardiac: RRR; no murmurs, rubs, or gallops,no edema  Respiratory:  clear to auscultation bilaterally, normal work of breathing GI: soft,  nontender, nondistended, + BS MS: no deformity or atrophy Skin: warm and dry, no rash Neuro:  Strength and sensation are intact Psych: euthymic mood, full affect   EKG:   The ekg ordered today demonstrates sinus rhythm   Recent Labs: No results found for requested labs within last 365 days.   Lipid Panel    Component Value Date/Time   CHOL 184 06/04/2020 0738   TRIG 91 06/04/2020 0738   HDL 76 06/04/2020 0738   CHOLHDL 2.4 06/04/2020 0738   LDLCALC 92 06/04/2020 0738     Other studies Reviewed: Additional studies/ records that were reviewed today with results demonstrating: labs reviewed.   ASSESSMENT AND PLAN:  Family history of CAD: She has coronary calcification.  Preventive therapy.  No angina at this time.  We talked about healthy diet.  She is trying to be healthy and trying to lose weight.  We again went over her calcium score which was relatively low, but given her family history of heart disease, we are trying to be aggressive with prevention. Hyperlipidemia: Whole food, plant based diet.  High fiber diet.  Avoid processed foods.  Hypertension: Trying to lose weight.  Weight loss medicine being considered by Dr. 08/04/2020.  Obesity: We will check with the pharmacist regarding possibility of using weight loss medicine.   Current medicines are reviewed at length with the patient today.  The patient concerns regarding her medicines were addressed.  The following changes have been made:  No change  Labs/ tests ordered today include:  No orders of the defined types were placed in this encounter.   Recommend 150 minutes/week of aerobic exercise Low fat, low carb, high fiber diet recommended  Disposition:   FU in 1 year   Signed, Marvis Moeller, MD  08/19/2021 3:55 PM    [facility] [address] [place], [place], [place]  [place] Phone: [phone]; Fax: [phone]

## 2021-08-19 ENCOUNTER — Encounter: Payer: Self-pay | Admitting: Interventional Cardiology

## 2021-08-19 ENCOUNTER — Ambulatory Visit: Payer: 59 | Admitting: Interventional Cardiology

## 2021-08-19 VITALS — BP 140/90 | HR 57 | Ht 65.0 in | Wt 187.0 lb

## 2021-08-19 DIAGNOSIS — E782 Mixed hyperlipidemia: Secondary | ICD-10-CM | POA: Diagnosis not present

## 2021-08-19 DIAGNOSIS — I2510 Atherosclerotic heart disease of native coronary artery without angina pectoris: Secondary | ICD-10-CM | POA: Diagnosis not present

## 2021-08-19 DIAGNOSIS — I2584 Coronary atherosclerosis due to calcified coronary lesion: Secondary | ICD-10-CM

## 2021-08-19 DIAGNOSIS — I10 Essential (primary) hypertension: Secondary | ICD-10-CM

## 2021-08-19 DIAGNOSIS — Z8249 Family history of ischemic heart disease and other diseases of the circulatory system: Secondary | ICD-10-CM

## 2021-08-19 NOTE — Patient Instructions (Signed)
Medication Instructions:  Your physician recommends that you continue on your current medications as directed. Please refer to the Current Medication list given to you today.  *If you need a refill on your cardiac medications before your next appointment, please call your pharmacy*   Lab Work: Your physician recommends that you return for lab work on August 25, 2021. CBC, CMET and lipids.  This will be fasting.  The lab opens at 7:15 AM  If you have labs (blood work) drawn today and your tests are completely normal, you will receive your results only by: MyChart Message (if you have MyChart) OR A paper copy in the mail If you have any lab test that is abnormal or we need to change your treatment, we will call you to review the results.   Testing/Procedures: none   Follow-Up: At [facility], you and your health needs are our priority.  As part of our continuing mission to provide you with exceptional heart care, we have created designated Provider Care Teams.  These Care Teams include your primary Cardiologist (physician) and Advanced Practice Providers (APPs -  Physician Assistants and Nurse Practitioners) who all work together to provide you with the care you need, when you need it.  We recommend signing up for the patient portal called "MyChart".  Sign up information is provided on this After Visit Summary.  MyChart is used to connect with patients for Virtual Visits (Telemedicine).  Patients are able to view lab/test results, encounter notes, upcoming appointments, etc.  Non-urgent messages can be sent to your provider as well.   To learn more about what you can do with MyChart, go to [email or web address].    Your next appointment:   12 month(s)  The format for your next appointment:   In Person  Provider:   Dr Eldridge Dace  If primary card or EP is not listed click here to update    :1}    Other Instructions    Important Information About Sugar

## 2021-08-21 ENCOUNTER — Telehealth: Payer: Self-pay | Admitting: *Deleted

## 2021-08-21 NOTE — Telephone Encounter (Signed)
Corky Crafts, MD  Dossie Arbour, RN Please let her know wegovy would be a possibility but is on backorder.  Can check again in 3 months  Patient notified.  She will call back in about 3 months to follow up

## 2021-08-25 ENCOUNTER — Other Ambulatory Visit: Payer: 59

## 2021-08-25 DIAGNOSIS — Z8249 Family history of ischemic heart disease and other diseases of the circulatory system: Secondary | ICD-10-CM

## 2021-08-25 DIAGNOSIS — I10 Essential (primary) hypertension: Secondary | ICD-10-CM

## 2021-08-25 DIAGNOSIS — I2510 Atherosclerotic heart disease of native coronary artery without angina pectoris: Secondary | ICD-10-CM

## 2021-08-25 DIAGNOSIS — E782 Mixed hyperlipidemia: Secondary | ICD-10-CM

## 2021-08-26 LAB — COMPREHENSIVE METABOLIC PANEL
ALT: 12 IU/L (ref 0–32)
AST: 17 IU/L (ref 0–40)
Albumin/Globulin Ratio: 2 (ref 1.2–2.2)
Albumin: 4.5 g/dL (ref 3.9–4.9)
Alkaline Phosphatase: 70 IU/L (ref 44–121)
BUN/Creatinine Ratio: 28 (ref 12–28)
BUN: 25 mg/dL (ref 8–27)
Bilirubin Total: 0.6 mg/dL (ref 0.0–1.2)
CO2: 24 mmol/L (ref 20–29)
Calcium: 9 mg/dL (ref 8.7–10.3)
Chloride: 104 mmol/L (ref 96–106)
Creatinine, Ser: 0.88 mg/dL (ref 0.57–1.00)
Globulin, Total: 2.2 g/dL (ref 1.5–4.5)
Glucose: 92 mg/dL (ref 70–99)
Potassium: 4.4 mmol/L (ref 3.5–5.2)
Sodium: 140 mmol/L (ref 134–144)
Total Protein: 6.7 g/dL (ref 6.0–8.5)
eGFR: 75 mL/min/{1.73_m2} (ref >59)

## 2021-08-26 LAB — LIPID PANEL
Chol/HDL Ratio: 2.6 ratio (ref 0.0–4.4)
Cholesterol, Total: 179 mg/dL (ref 100–199)
HDL: 70 mg/dL (ref >39)
LDL Chol Calc (NIH): 92 mg/dL (ref 0–99)
Triglycerides: 95 mg/dL (ref 0–149)
VLDL Cholesterol Cal: 17 mg/dL (ref 5–40)

## 2021-08-26 LAB — CBC
Hematocrit: 39.6 % (ref 34.0–46.6)
Hemoglobin: 13.1 g/dL (ref 11.1–15.9)
MCH: 29.9 pg (ref 26.6–33.0)
MCHC: 33.1 g/dL (ref 31.5–35.7)
MCV: 90 fL (ref 79–97)
Platelets: 172 10*3/uL (ref 150–450)
RBC: 4.38 x10E6/uL (ref 3.77–5.28)
RDW: 12.5 % (ref 11.7–15.4)
WBC: 4.8 10*3/uL (ref 3.4–10.8)

## 2021-11-20 ENCOUNTER — Encounter: Payer: Self-pay | Admitting: Interventional Cardiology

## 2021-12-29 ENCOUNTER — Other Ambulatory Visit: Payer: Self-pay | Admitting: Interventional Cardiology

## 2022-06-21 ENCOUNTER — Other Ambulatory Visit: Payer: Self-pay | Admitting: Interventional Cardiology

## 2022-09-14 ENCOUNTER — Other Ambulatory Visit: Payer: Self-pay | Admitting: Interventional Cardiology

## 2022-10-12 DIAGNOSIS — K148 Other diseases of tongue: Secondary | ICD-10-CM | POA: Diagnosis not present

## 2022-10-12 DIAGNOSIS — H9193 Unspecified hearing loss, bilateral: Secondary | ICD-10-CM | POA: Diagnosis not present

## 2022-10-12 DIAGNOSIS — H9313 Tinnitus, bilateral: Secondary | ICD-10-CM | POA: Diagnosis not present

## 2022-10-15 DIAGNOSIS — M47812 Spondylosis without myelopathy or radiculopathy, cervical region: Secondary | ICD-10-CM | POA: Diagnosis not present

## 2022-11-01 NOTE — Progress Notes (Unsigned)
   Cardiology Office Note    Date:  11/01/2022  ID:  Matrice, Herro 09-11-1960, MRN [id number] PCP:  Noberto Retort, MD  Cardiologist:  Lance Muss, MD  Electrophysiologist:  None   Chief Complaint: ***  History of Present Illness: .    Melissa [name] is a 62 y.o. female with visit-pertinent history of coronary calcification, mild dilation of ascending aorta, HTN, HLD, family history of cardiac disease seen for annual follow-up. Per Dr. Hoyle Barr note, "Father had MI at age 19.  He had CABG.  Second MI at 63 and died. Older brother who had MI at 33 with CABG.  Another brother with hole in his heart- corrected in [place].  Genetic abnormality as well. " Her calcium score was 40, 84%ile, with mild dilation of ascending aorta in 2022.    No tsh if needed Last labs 2023 Echo vs MRA or Ct Mention of GLP1 last OV?   Coronary calcification, HLD Mild dilation of ascending aorta HTN  Labwork independently reviewed: 08/2021 LDL 92, trig 95, CMET, CBC wnl K 4.4, Cr 0.88  ROS: .    Please see the history of present illness. Otherwise, review of systems is positive for ***.  All other systems are reviewed and otherwise negative.  Studies Reviewed: Marland Kitchen    EKG:  EKG is ordered today, personally reviewed, demonstrating ***  CV Studies: Cardiac studies reviewed are outlined and summarized above. Otherwise please see EMR for full report.   Current Reported Medications:.    No outpatient medications have been marked as taking for the 11/02/22 encounter (Appointment) with Laurann Montana, PA-C.    Physical Exam:    VS:  There were no vitals taken for this visit.   Wt Readings from Last 3 Encounters:  08/19/21 187 lb (84.8 kg)  02/26/20 177 lb (80.3 kg)  01/11/17 147 lb (66.7 kg)    GEN: Well nourished, well developed in no acute distress NECK: No JVD; No carotid bruits CARDIAC: ***RRR, no murmurs, rubs, gallops RESPIRATORY:  Clear to auscultation without  rales, wheezing or rhonchi  ABDOMEN: Soft, non-tender, non-distended EXTREMITIES:  No edema; No acute deformity   Asessement and Plan:.     ***     Disposition: F/u with ***  Signed, Laurann Montana, PA-C

## 2022-11-02 ENCOUNTER — Encounter: Payer: Self-pay | Admitting: Physician Assistant

## 2022-11-02 ENCOUNTER — Ambulatory Visit: Payer: BC Managed Care – PPO | Attending: Physician Assistant | Admitting: Physician Assistant

## 2022-11-02 VITALS — BP 128/82 | HR 60 | Ht 65.0 in | Wt 145.8 lb

## 2022-11-02 DIAGNOSIS — I10 Essential (primary) hypertension: Secondary | ICD-10-CM

## 2022-11-02 DIAGNOSIS — R0989 Other specified symptoms and signs involving the circulatory and respiratory systems: Secondary | ICD-10-CM

## 2022-11-02 DIAGNOSIS — I2510 Atherosclerotic heart disease of native coronary artery without angina pectoris: Secondary | ICD-10-CM | POA: Diagnosis not present

## 2022-11-02 DIAGNOSIS — I77810 Thoracic aortic ectasia: Secondary | ICD-10-CM

## 2022-11-02 DIAGNOSIS — E785 Hyperlipidemia, unspecified: Secondary | ICD-10-CM | POA: Diagnosis not present

## 2022-11-02 DIAGNOSIS — R002 Palpitations: Secondary | ICD-10-CM

## 2022-11-02 DIAGNOSIS — R5383 Other fatigue: Secondary | ICD-10-CM

## 2022-11-02 NOTE — Patient Instructions (Signed)
Medication Instructions:  Your physician recommends that you continue on your current medications as directed. Please refer to the Current Medication list given to you today.  *If you need a refill on your cardiac medications before your next appointment, please call your pharmacy*   Lab Work: WHEN YOU COME FOR TESTING:  FASTING LIPID, CMT, CBC, & TSH  If you have labs (blood work) drawn today and your tests are completely normal, you will receive your results only by: MyChart Message (if you have MyChart) OR A paper copy in the mail If you have any lab test that is abnormal or we need to change your treatment, we will call you to review the results.   Testing/Procedures: Your physician has requested that you have an echocardiogram. Echocardiography is a painless test that uses sound waves to create images of your heart. It provides your doctor with information about the size and shape of your heart and how well your heart's chambers and valves are working. This procedure takes approximately one hour. There are no restrictions for this procedure. Please do NOT wear cologne, perfume, aftershave, or lotions (deodorant is allowed). Please arrive 15 minutes prior to your appointment time.   Your physician has requested that you have a carotid duplex. This test is an ultrasound of the carotid arteries in your neck. It looks at blood flow through these arteries that supply the brain with blood. Allow one hour for this exam. There are no restrictions or special instructions.   Your physician has requested that you have an exercise tolerance test. For further information please visit [email or web address] Please also follow instruction sheet, BELOW:  - DO NOT EAT, DRINK, OR USE TOBACCO PRODUCTS WITHIN 4 HOURS OF THE TEST - DRESS PREPARED TO EXERCISE IN A COMFORTABLE, 2 PIECE CLOTHING OUTFIT AND WALKING SHOES - BRING ANY PRESCRIPTION MEDICATIONS WITH YOU  - NOTIFY THE OFFICE 24 HOURS IN ADVANCE IF  YOU NEED TO CANCEL - CALL THE OFFICE [phone] IF YOU HAVE ANY QUESTIONS    Follow-Up: At [facility], you and your health needs are our priority.  As part of our continuing mission to provide you with exceptional heart care, we have created designated Provider Care Teams.  These Care Teams include your primary Cardiologist (physician) and Advanced Practice Providers (APPs -  Physician Assistants and Nurse Practitioners) who all work together to provide you with the care you need, when you need it.  We recommend signing up for the patient portal called "MyChart".  Sign up information is provided on this After Visit Summary.  MyChart is used to connect with patients for Virtual Visits (Telemedicine).  Patients are able to view lab/test results, encounter notes, upcoming appointments, etc.  Non-urgent messages can be sent to your provider as well.   To learn more about what you can do with MyChart, go to [email or web address].    Your next appointment:   12 month(s)  Provider:   PT WILL CALL      Other Instructions

## 2022-11-04 DIAGNOSIS — M810 Age-related osteoporosis without current pathological fracture: Secondary | ICD-10-CM | POA: Diagnosis not present

## 2022-11-08 ENCOUNTER — Ambulatory Visit: Payer: BC Managed Care – PPO | Attending: Physician Assistant

## 2022-11-08 DIAGNOSIS — I77810 Thoracic aortic ectasia: Secondary | ICD-10-CM

## 2022-11-08 DIAGNOSIS — I2510 Atherosclerotic heart disease of native coronary artery without angina pectoris: Secondary | ICD-10-CM | POA: Diagnosis not present

## 2022-11-08 DIAGNOSIS — I10 Essential (primary) hypertension: Secondary | ICD-10-CM | POA: Diagnosis not present

## 2022-11-08 DIAGNOSIS — R0989 Other specified symptoms and signs involving the circulatory and respiratory systems: Secondary | ICD-10-CM

## 2022-11-08 DIAGNOSIS — R002 Palpitations: Secondary | ICD-10-CM | POA: Diagnosis not present

## 2022-11-08 DIAGNOSIS — E785 Hyperlipidemia, unspecified: Secondary | ICD-10-CM | POA: Diagnosis not present

## 2022-11-08 DIAGNOSIS — R5383 Other fatigue: Secondary | ICD-10-CM | POA: Diagnosis not present

## 2022-11-09 ENCOUNTER — Telehealth: Payer: Self-pay

## 2022-11-09 DIAGNOSIS — E785 Hyperlipidemia, unspecified: Secondary | ICD-10-CM

## 2022-11-09 DIAGNOSIS — M25511 Pain in right shoulder: Secondary | ICD-10-CM | POA: Diagnosis not present

## 2022-11-09 LAB — LIPID PANEL
Cholesterol, Total: 183 mg/dL (ref 100–199)
HDL: 67 mg/dL (ref >39)
LDL CALC COMMENT:: 2.7 ratio (ref 0.0–4.4)
LDL Chol Calc (NIH): 105 mg/dL — ABNORMAL HIGH (ref 0–99)
Triglycerides: 57 mg/dL (ref 0–149)
VLDL Cholesterol Cal: 11 mg/dL (ref 5–40)

## 2022-11-09 LAB — COMPREHENSIVE METABOLIC PANEL
ALT: 23 IU/L (ref 0–32)
AST: 25 IU/L (ref 0–40)
Albumin: 4.3 g/dL (ref 3.9–4.9)
Alkaline Phosphatase: 72 IU/L (ref 44–121)
BUN/Creatinine Ratio: 24 (ref 12–28)
BUN: 16 mg/dL (ref 8–27)
Bilirubin Total: 0.4 mg/dL (ref 0.0–1.2)
CO2: 25 mmol/L (ref 20–29)
Calcium: 9.2 mg/dL (ref 8.7–10.3)
Chloride: 104 mmol/L (ref 96–106)
Creatinine, Ser: 0.68 mg/dL (ref 0.57–1.00)
Globulin, Total: 2.1 g/dL (ref 1.5–4.5)
Glucose: 89 mg/dL (ref 70–99)
Potassium: 4.4 mmol/L (ref 3.5–5.2)
Sodium: 142 mmol/L (ref 134–144)
Total Protein: 6.4 g/dL (ref 6.0–8.5)
eGFR: 98 mL/min/{1.73_m2} (ref >59)

## 2022-11-09 LAB — CBC
Hematocrit: 39.5 % (ref 34.0–46.6)
Hemoglobin: 12.4 g/dL (ref 11.1–15.9)
MCH: 29.8 pg (ref 26.6–33.0)
MCHC: 31.4 g/dL — ABNORMAL LOW (ref 31.5–35.7)
MCV: 95 fL (ref 79–97)
Platelets: 160 10*3/uL (ref 150–450)
RBC: 4.16 x10E6/uL (ref 3.77–5.28)
RDW: 12 % (ref 11.7–15.4)
WBC: 4.1 10*3/uL (ref 3.4–10.8)

## 2022-11-09 LAB — TSH: TSH: 4.79 u[IU]/mL — ABNORMAL HIGH (ref 0.450–4.500)

## 2022-11-09 MED ORDER — ROSUVASTATIN CALCIUM 20 MG PO TABS: 20.0000 mg | ORAL_TABLET | Freq: Every day | ORAL | 3 refills | Status: DC

## 2022-11-09 NOTE — Telephone Encounter (Signed)
Spoke with patient and she is aware of lab results and provider recommendations. She verbalized understanding . New Rx sent to pharmacy. Labs ordered.

## 2022-11-09 NOTE — Telephone Encounter (Signed)
-----   Message from Laurann Montana sent at 11/09/2022 10:35 AM EDT ----- Please let pt know that labs are OK except LDL too high for coronary calcification history. Recommend increase rosuvastatin to 20mg  daily with repeat fasting lipid panel and liver function in 2 months. She should also f/u with PCP for mildly elevated TSH which could indicate underactive thyroid. (It's not 100% certain - PCP typically repeats with follow-up testing to confirm.) Thank you.

## 2022-11-10 DIAGNOSIS — M810 Age-related osteoporosis without current pathological fracture: Secondary | ICD-10-CM | POA: Diagnosis not present

## 2022-11-10 NOTE — Addendum Note (Signed)
Addended by: Serita Sheller R on: 11/10/2022 04:15 PM   Modules accepted: Orders

## 2022-11-12 ENCOUNTER — Ambulatory Visit (HOSPITAL_COMMUNITY)
Admission: RE | Admit: 2022-11-12 | Discharge: 2022-11-12 | Disposition: A | Discharge status: Home / Self Care | Payer: BC Managed Care – PPO | Source: Ambulatory Visit | Attending: Cardiology | Admitting: Cardiology

## 2022-11-12 DIAGNOSIS — R002 Palpitations: Secondary | ICD-10-CM | POA: Insufficient documentation

## 2022-11-12 DIAGNOSIS — I2510 Atherosclerotic heart disease of native coronary artery without angina pectoris: Secondary | ICD-10-CM | POA: Insufficient documentation

## 2022-11-12 DIAGNOSIS — R5383 Other fatigue: Secondary | ICD-10-CM | POA: Diagnosis not present

## 2022-11-12 DIAGNOSIS — I77810 Thoracic aortic ectasia: Secondary | ICD-10-CM | POA: Insufficient documentation

## 2022-11-12 DIAGNOSIS — E785 Hyperlipidemia, unspecified: Secondary | ICD-10-CM | POA: Diagnosis not present

## 2022-11-12 DIAGNOSIS — R0989 Other specified symptoms and signs involving the circulatory and respiratory systems: Secondary | ICD-10-CM | POA: Diagnosis not present

## 2022-11-12 DIAGNOSIS — I10 Essential (primary) hypertension: Secondary | ICD-10-CM | POA: Insufficient documentation

## 2022-11-18 ENCOUNTER — Ambulatory Visit: Payer: BC Managed Care – PPO | Attending: Cardiology

## 2022-11-18 ENCOUNTER — Ambulatory Visit (INDEPENDENT_AMBULATORY_CARE_PROVIDER_SITE_OTHER): Payer: BC Managed Care – PPO

## 2022-11-18 DIAGNOSIS — I77810 Thoracic aortic ectasia: Secondary | ICD-10-CM

## 2022-11-18 DIAGNOSIS — I2510 Atherosclerotic heart disease of native coronary artery without angina pectoris: Secondary | ICD-10-CM

## 2022-11-18 DIAGNOSIS — I10 Essential (primary) hypertension: Secondary | ICD-10-CM | POA: Diagnosis not present

## 2022-11-18 DIAGNOSIS — R0989 Other specified symptoms and signs involving the circulatory and respiratory systems: Secondary | ICD-10-CM

## 2022-11-18 DIAGNOSIS — R002 Palpitations: Secondary | ICD-10-CM | POA: Diagnosis not present

## 2022-11-18 DIAGNOSIS — E785 Hyperlipidemia, unspecified: Secondary | ICD-10-CM | POA: Diagnosis not present

## 2022-11-18 DIAGNOSIS — R5383 Other fatigue: Secondary | ICD-10-CM

## 2022-11-18 LAB — EXERCISE TOLERANCE TEST
Angina Index: 0
Base ST Depression (mm): 0 mm
Estimated workload: 13.4
Exercise duration (min): 11 min
Exercise duration (sec): 0 s
MPHR: 158 {beats}/min
Peak HR: 148 {beats}/min
Percent HR: 93 %
RPE: 17
Rest HR: 59 {beats}/min

## 2022-11-18 LAB — ECHOCARDIOGRAM COMPLETE
Area-P 1/2: 4.57 cm2
S' Lateral: 3.1 cm

## 2022-12-13 DIAGNOSIS — M25519 Pain in unspecified shoulder: Secondary | ICD-10-CM | POA: Diagnosis not present

## 2022-12-13 DIAGNOSIS — M542 Cervicalgia: Secondary | ICD-10-CM | POA: Diagnosis not present

## 2022-12-21 DIAGNOSIS — M542 Cervicalgia: Secondary | ICD-10-CM | POA: Diagnosis not present

## 2022-12-21 DIAGNOSIS — M25519 Pain in unspecified shoulder: Secondary | ICD-10-CM | POA: Diagnosis not present

## 2022-12-28 DIAGNOSIS — M542 Cervicalgia: Secondary | ICD-10-CM | POA: Diagnosis not present

## 2022-12-28 DIAGNOSIS — M25519 Pain in unspecified shoulder: Secondary | ICD-10-CM | POA: Diagnosis not present

## 2023-01-05 DIAGNOSIS — Z20822 Contact with and (suspected) exposure to covid-19: Secondary | ICD-10-CM | POA: Diagnosis not present

## 2023-01-05 DIAGNOSIS — R051 Acute cough: Secondary | ICD-10-CM | POA: Diagnosis not present

## 2023-01-13 IMAGING — CT CT CARDIAC CORONARY ARTERY CALCIUM SCORE
3 series · 14 of 20 positions shown, 15 images · non-contrast
Comparison: None.
COMPARISON: None.

Addendum:
EXAM:
OVER-READ INTERPRETATION  CT CHEST

The following report is an over-read performed by radiologist Dr.
Chamir Mido [REDACTED] on 03/06/2020. This
over-read does not include interpretation of cardiac or coronary
anatomy or pathology. The coronary calcium score interpretation by
the cardiologist is attached.
CLINICAL DATA: Risk stratification: 59 Year-old White Female
Coronary Calcium Score
TECHNIQUE: The patient was scanned on a Siemens Force scanner. Axial
non-contrast 3 mm slices were carried out through the heart. The
data set was analyzed on a dedicated work station and scored using
the Agatson method.

[Series 2: casc 3.0 bv41 2 bestdiast 71 % · axial · 0.38mm/px · z∈[-208,-133]mm · 4 of 43 slices shown, 5 images]
[im 9/43  vessel]
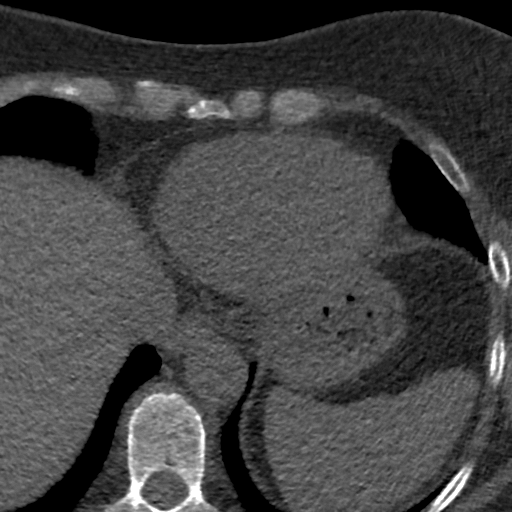
[im 9/43  lung]
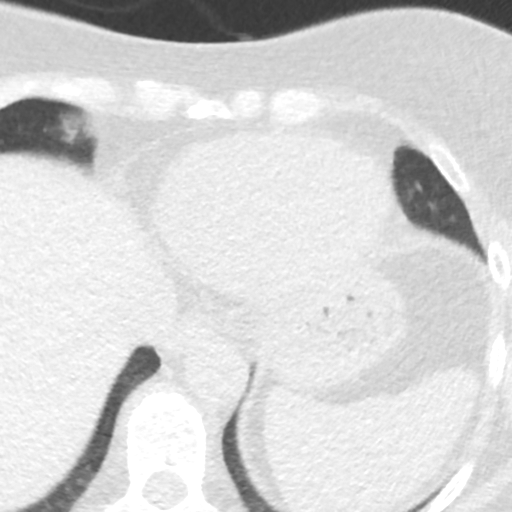
[im 17/43  vessel]
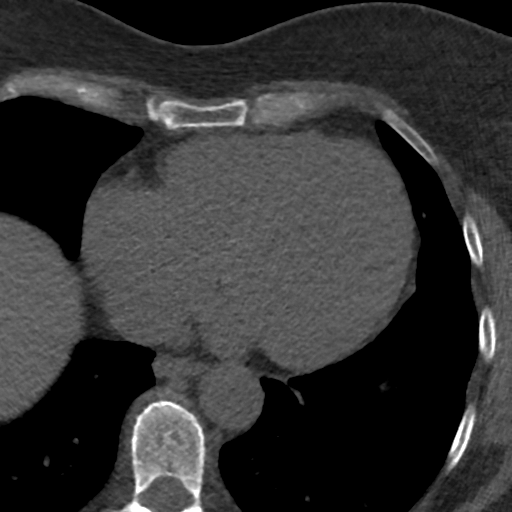
[im 26/43  vessel]
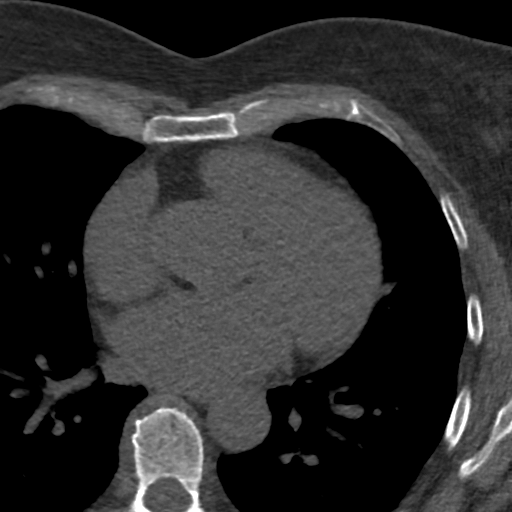
[im 34/43  vessel]
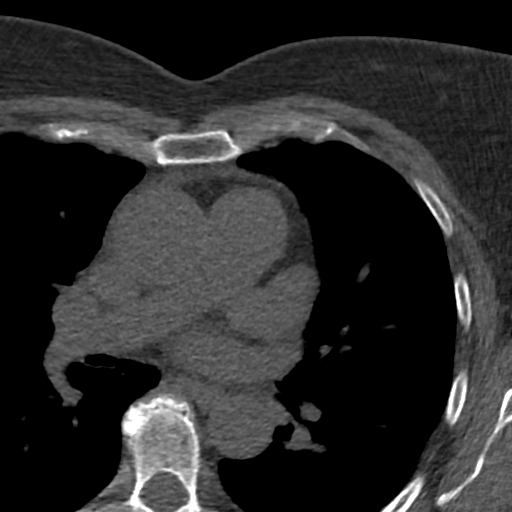

[Series 3: lung 70 % · axial · 0.64mm/px · z∈[-210,-126]mm · 5 of 43 slices shown]
[im 8/43  lung]
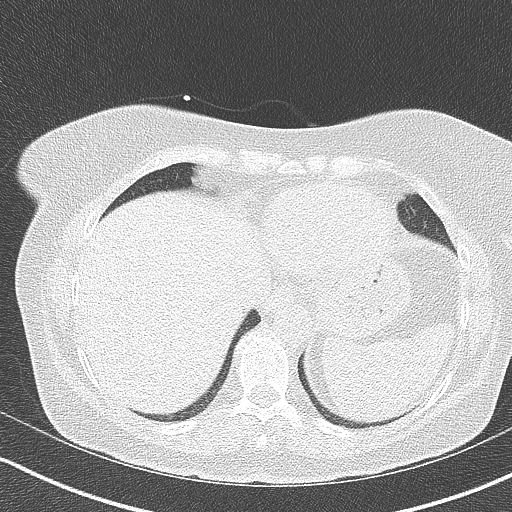
[im 15/43  lung]
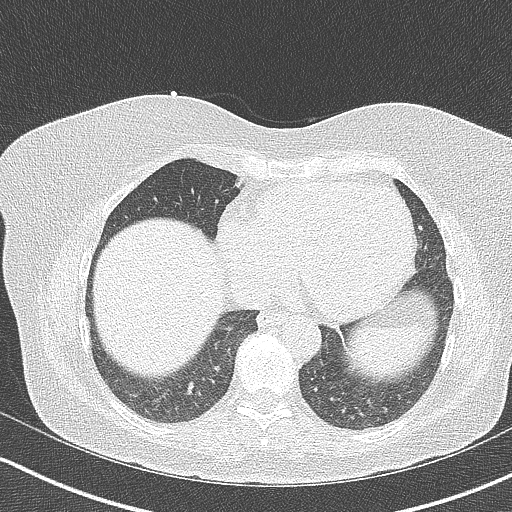
[im 22/43  lung]
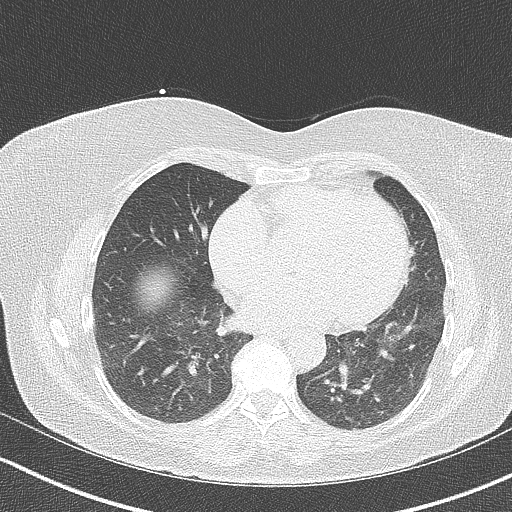
[im 29/43  lung]
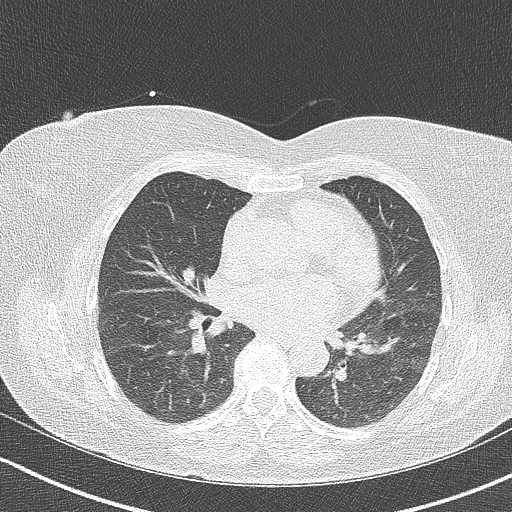
[im 36/43  lung]
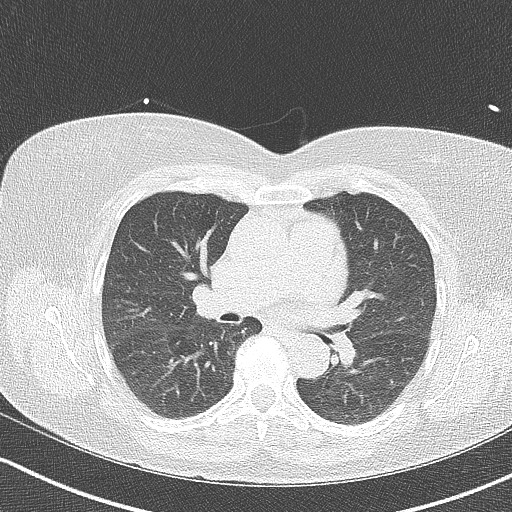

[Series 4: lung st 70 % · axial · 0.64mm/px · z∈[-210,-126]mm · 5 of 43 slices shown]
[im 8/43  lung]
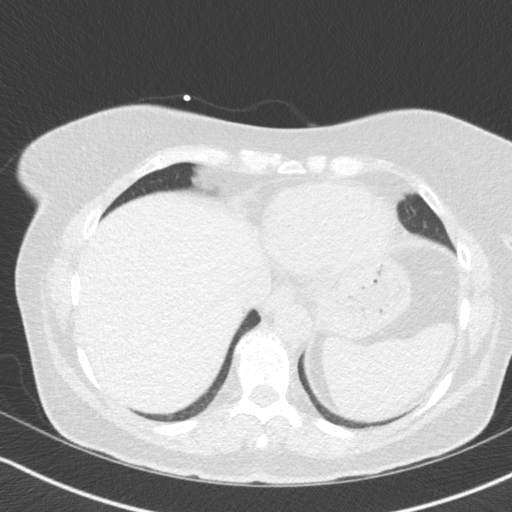
[im 15/43  lung]
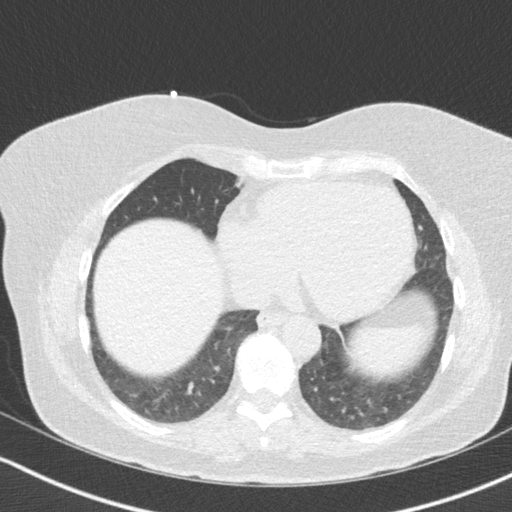
[im 22/43  lung]
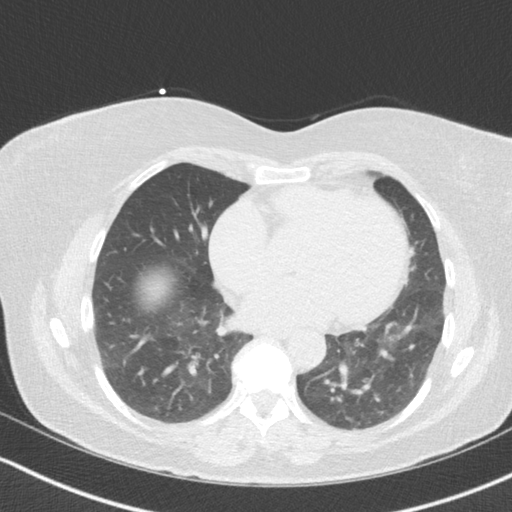
[im 29/43  lung]
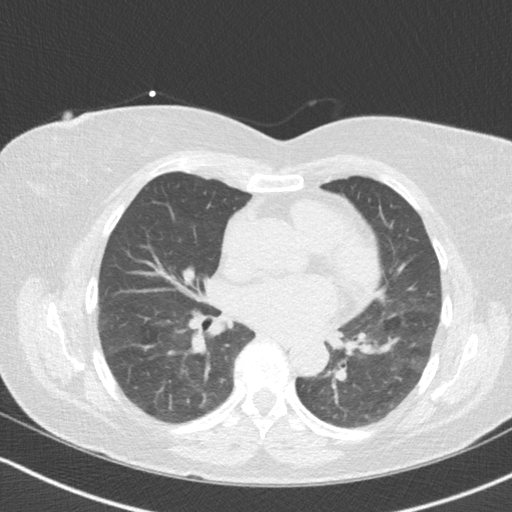
[im 36/43  lung]
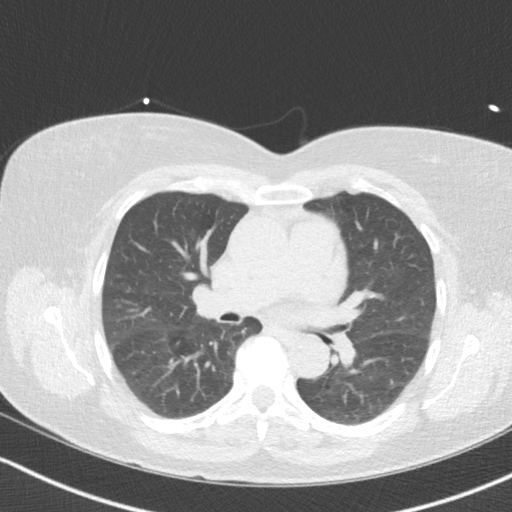

[14 of 20 positions shown; findings below may reference images not displayed]

FINDINGS: Within the visualized portions of the thorax there are no suspicious
appearing pulmonary nodules or masses, there is no acute
consolidative airspace disease, no pleural effusions, no
pneumothorax and no lymphadenopathy. Visualized portions of the
upper abdomen are unremarkable. There are no aggressive appearing
lytic or blastic lesions noted in the visualized portions of the
skeleton.
IMPRESSION: No significant incidental noncardiac findings are noted.
FINDINGS: Non-cardiac: See separate report from [REDACTED].

Ascending Aorta: Mild dilation on non-contrasted study: 40 mm.

Pericardium: Normal.

Mitral Apparatus: Mild mitral annular calcification

Coronary arteries: Normal origins.

Coronary calcium score of 40. This was 84th percentile for age,
gender, and race matched controls.
IMPRESSION: 1. Coronary calcium score of 40. This was 84th percentile for age,
gender, and race matched controls.

2. Mild dilation of ascending aorta on non-contrasted study (40 mm);
consider dedicated imaging assessment if clinically indicated.

3.  Mild mitral annular calcification.

*** End of Addendum ***
EXAM:
OVER-READ INTERPRETATION  CT CHEST

The following report is an over-read performed by radiologist Dr.
Chamir Mido [REDACTED] on 03/06/2020. This
over-read does not include interpretation of cardiac or coronary
anatomy or pathology. The coronary calcium score interpretation by
the cardiologist is attached.
FINDINGS: Within the visualized portions of the thorax there are no suspicious
appearing pulmonary nodules or masses, there is no acute
consolidative airspace disease, no pleural effusions, no
pneumothorax and no lymphadenopathy. Visualized portions of the
upper abdomen are unremarkable. There are no aggressive appearing
lytic or blastic lesions noted in the visualized portions of the
skeleton.
IMPRESSION: No significant incidental noncardiac findings are noted.

## 2023-05-10 DIAGNOSIS — Z85828 Personal history of other malignant neoplasm of skin: Secondary | ICD-10-CM | POA: Diagnosis not present

## 2023-05-10 DIAGNOSIS — L570 Actinic keratosis: Secondary | ICD-10-CM | POA: Diagnosis not present

## 2023-05-10 DIAGNOSIS — L738 Other specified follicular disorders: Secondary | ICD-10-CM | POA: Diagnosis not present

## 2023-05-10 DIAGNOSIS — D225 Melanocytic nevi of trunk: Secondary | ICD-10-CM | POA: Diagnosis not present

## 2023-05-10 DIAGNOSIS — L578 Other skin changes due to chronic exposure to nonionizing radiation: Secondary | ICD-10-CM | POA: Diagnosis not present

## 2023-05-11 DIAGNOSIS — Z01419 Encounter for gynecological examination (general) (routine) without abnormal findings: Secondary | ICD-10-CM | POA: Diagnosis not present

## 2023-05-11 DIAGNOSIS — Z1231 Encounter for screening mammogram for malignant neoplasm of breast: Secondary | ICD-10-CM | POA: Diagnosis not present

## 2023-05-11 DIAGNOSIS — Z6826 Body mass index (BMI) 26.0-26.9, adult: Secondary | ICD-10-CM | POA: Diagnosis not present

## 2023-05-11 DIAGNOSIS — Z13820 Encounter for screening for osteoporosis: Secondary | ICD-10-CM | POA: Diagnosis not present

## 2023-05-17 ENCOUNTER — Other Ambulatory Visit: Payer: Self-pay | Admitting: Obstetrics and Gynecology

## 2023-05-17 DIAGNOSIS — R928 Other abnormal and inconclusive findings on diagnostic imaging of breast: Secondary | ICD-10-CM

## 2023-05-19 DIAGNOSIS — M810 Age-related osteoporosis without current pathological fracture: Secondary | ICD-10-CM | POA: Diagnosis not present

## 2023-05-30 ENCOUNTER — Ambulatory Visit
Admission: RE | Admit: 2023-05-30 | Discharge: 2023-05-30 | Disposition: A | Discharge status: Home / Self Care | Source: Ambulatory Visit | Attending: Obstetrics and Gynecology | Admitting: Obstetrics and Gynecology

## 2023-05-30 ENCOUNTER — Ambulatory Visit

## 2023-05-30 DIAGNOSIS — R928 Other abnormal and inconclusive findings on diagnostic imaging of breast: Secondary | ICD-10-CM

## 2023-05-30 DIAGNOSIS — R922 Inconclusive mammogram: Secondary | ICD-10-CM | POA: Diagnosis not present

## 2023-07-14 DIAGNOSIS — F324 Major depressive disorder, single episode, in partial remission: Secondary | ICD-10-CM | POA: Diagnosis not present

## 2023-07-14 DIAGNOSIS — E7800 Pure hypercholesterolemia, unspecified: Secondary | ICD-10-CM | POA: Diagnosis not present

## 2023-07-14 DIAGNOSIS — M810 Age-related osteoporosis without current pathological fracture: Secondary | ICD-10-CM | POA: Diagnosis not present

## 2023-07-30 DIAGNOSIS — R509 Fever, unspecified: Secondary | ICD-10-CM | POA: Diagnosis not present

## 2023-07-30 DIAGNOSIS — B349 Viral infection, unspecified: Secondary | ICD-10-CM | POA: Diagnosis not present

## 2023-07-30 DIAGNOSIS — J029 Acute pharyngitis, unspecified: Secondary | ICD-10-CM | POA: Diagnosis not present

## 2023-10-22 ENCOUNTER — Other Ambulatory Visit: Payer: Self-pay | Admitting: Physician Assistant

## 2023-10-22 DIAGNOSIS — E785 Hyperlipidemia, unspecified: Secondary | ICD-10-CM

## 2023-10-27 DIAGNOSIS — M810 Age-related osteoporosis without current pathological fracture: Secondary | ICD-10-CM | POA: Diagnosis not present

## 2023-12-28 DIAGNOSIS — H3540 Unspecified peripheral retinal degeneration: Secondary | ICD-10-CM | POA: Diagnosis not present

## 2024-01-05 IMAGING — CR DG CHEST 2V
2 series · 2 of 2 positions shown · non-contrast
Comparison: 01/11/2017

CLINICAL DATA: 60-year-old female with cough and congestion

EXAM:
CHEST - 2 VIEW

[w chest pa]
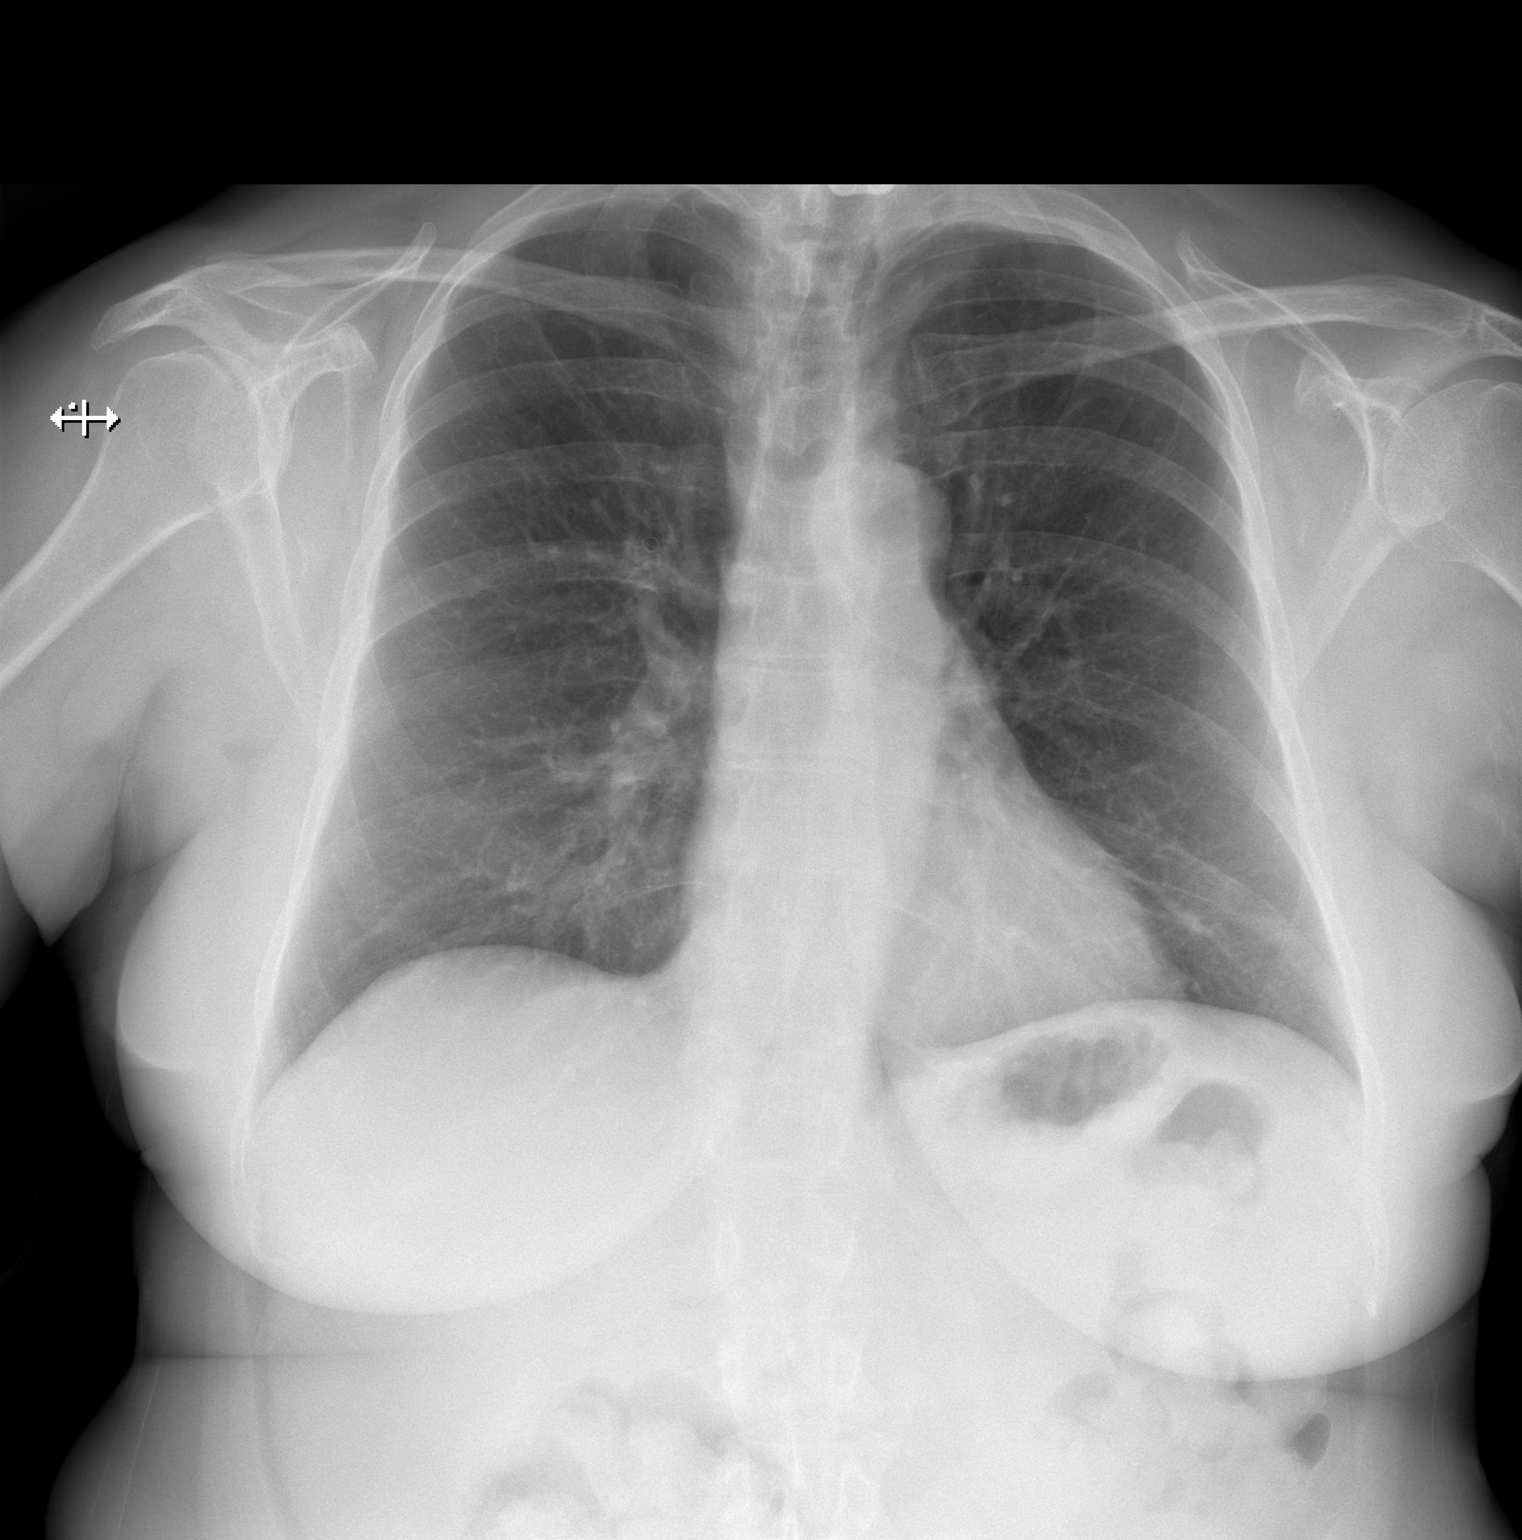

[w chest lat]
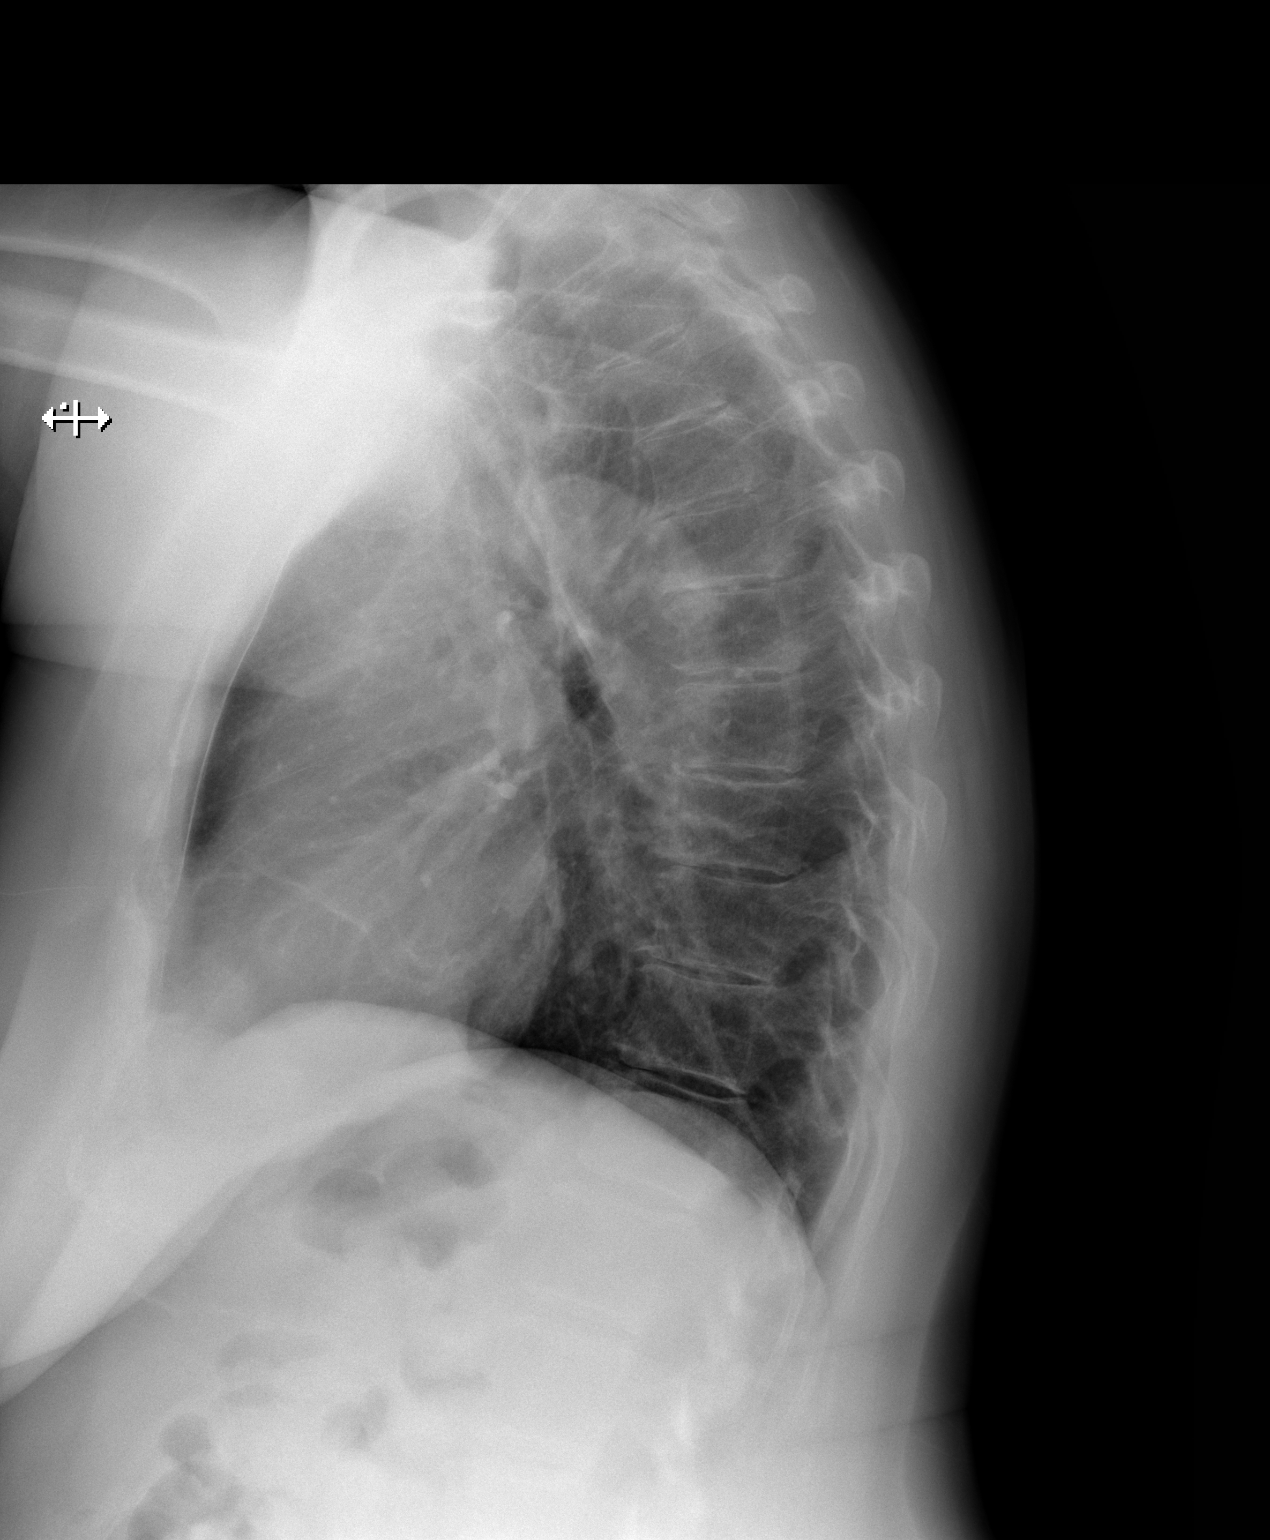

[2 of 2 positions shown; findings below may reference images not displayed]

FINDINGS: Cardiomediastinal silhouette unchanged in size and contour. No
evidence of central vascular congestion. No interlobular septal
thickening.

No pneumothorax or pleural effusion. Coarsened interstitial
markings, with no confluent airspace disease.

No acute displaced fracture. Degenerative changes of the spine.

Surgical changes of the cervical region, incompletely imaged
IMPRESSION: No active cardiopulmonary disease.

## 2024-02-16 ENCOUNTER — Ambulatory Visit
Attending: Student in an Organized Health Care Education/Training Program | Admitting: Student in an Organized Health Care Education/Training Program

## 2024-02-16 VITALS — BP 142/78 | HR 64 | Ht 64.0 in | Wt 170.0 lb

## 2024-02-16 DIAGNOSIS — I10 Essential (primary) hypertension: Secondary | ICD-10-CM

## 2024-02-16 DIAGNOSIS — R011 Cardiac murmur, unspecified: Secondary | ICD-10-CM | POA: Diagnosis not present

## 2024-02-16 DIAGNOSIS — E78019 Familial hypercholesterolemia, unspecified: Secondary | ICD-10-CM

## 2024-02-16 NOTE — Progress Notes (Signed)
 " Cardiology Office Note:  .   Date:  02/16/2024  ID:  Melissa [name], DOB 05/27/1960, MRN [id number] PCP: Arloa Elsie SAUNDERS, MD  [facility] Providers Cardiologist:  Georganna Archer, MD { Chief Complaint:  Chief Complaint  Patient presents with   Heart Problem    History of Present Illness: .    Melissa [name] is a 64 y.o. 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   Discussed the use of AI scribe software for clinical note transcription with the patient, who gave verbal consent to proceed.  History of Present Illness Melissa [name] is a 64 year old female who presents for evaluation of cardiovascular risk.  She has a significant family history of cardiovascular disease, including a brother who recently experienced a stroke due to a patent foramen ovale (PFO), another brother with a PFO, an oldest brother who suffered a heart attack and underwent heart surgery, a sister with congestive heart failure, and a father who passed away at 66 from a massive heart attack.  She is currently on a daily regimen of baby aspirin and Crestor  20 mg for heart health. She experiences a 'fluttering feeling' in her chest about once a month, lasting a few seconds, without associated lightheadedness or dizziness. This sensation has been occurring more frequently. No chest pain or shortness of breath.  Her prior calcium  score was 80. Her last cholesterol panel was conducted about two years ago. She has no known side effects from her current medications.  She has a past history of high blood pressure, for which she was previously medicated. After losing 50 pounds, she was able to discontinue the medication. At home, her blood pressure typically ranges from 128 to 132 mmHg. She does not actively monitor her sodium intake but consumes a diet rich in chicken, vegetables, and fruit.      Studies Reviewed: SABRA    EKG:  EKG  Interpretation Date/Time:  Thursday February 16 2024 15:30:45 EST Ventricular Rate:  69 PR Interval:  124 QRS Duration:  80 QT Interval:  420 QTC Calculation: 450 R Axis:   -36  Text Interpretation: Normal sinus rhythm Left axis deviation When compared with ECG of 02-Nov-2022 15:10, QRS axis Shifted left Confirmed by Archer Georganna [phone]) on 02/16/2024 3:32:11 PM     Cardiac Studies & Procedures   ______________________________________________________________________________________________   STRESS TESTS  EXERCISE TOLERANCE TEST (ETT) 11/18/2022  Interpretation Summary   Up sloping ST depression (II, III and aVF) was noted.   Negative, adequate stress test.   Excellent exercise capacity.   ECHOCARDIOGRAM  ECHOCARDIOGRAM COMPLETE 11/18/2022  Narrative ECHOCARDIOGRAM REPORT    Patient Name:   Melissa [name] Date of Exam: 11/18/2022 Medical Rec #:  [id number]             Height:       65.0 in Accession #:    [id number]            Weight:       145.8 lb Date of Birth:  1960/07/04              BSA:          1.729 m Patient Age:    62 years              BP:           157/61 mmHg Patient Gender: F  HR:           55 bpm. Exam Location:  [address]  Procedure: 2D Echo, 3D Echo, Cardiac Doppler, Color Doppler and Strain Analysis  Indications:    I77.810 S  History:        Patient has no prior history of Echocardiogram examinations. Carotid Disease, Signs/Symptoms:Palpitations; Risk Factors:Hypertension and Family History of Coronary Artery Disease.  Sonographer:    Waldo Guadalajara RCS Referring Phys: [id number] DAYNA N DUNN  IMPRESSIONS   1. Left ventricular ejection fraction, by estimation, is 55 to 60%. The left ventricle has normal function. The left ventricle has no regional wall motion abnormalities. Left ventricular diastolic parameters were normal. 2. Right ventricular systolic function is normal. The right ventricular size is normal. There  is normal pulmonary artery systolic pressure. 3. Left atrial size was mildly dilated. 4. The mitral valve is normal in structure. Mild mitral valve regurgitation. No evidence of mitral stenosis. 5. The aortic valve is normal in structure. Aortic valve regurgitation is not visualized. No aortic stenosis is present. 6. The inferior vena cava is normal in size with greater than 50% respiratory variability, suggesting right atrial pressure of 3 mmHg.  FINDINGS Left Ventricle: Left ventricular ejection fraction, by estimation, is 55 to 60%. The left ventricle has normal function. The left ventricle has no regional wall motion abnormalities. The left ventricular internal cavity size was normal in size. There is no left ventricular hypertrophy. Left ventricular diastolic parameters were normal.  Right Ventricle: The right ventricular size is normal. No increase in right ventricular wall thickness. Right ventricular systolic function is normal. There is normal pulmonary artery systolic pressure. The tricuspid regurgitant velocity is 1.82 m/s, and with an assumed right atrial pressure of 3 mmHg, the estimated right ventricular systolic pressure is 16.2 mmHg.  Left Atrium: Left atrial size was mildly dilated.  Right Atrium: Right atrial size was normal in size.  Pericardium: There is no evidence of pericardial effusion.  Mitral Valve: The mitral valve is normal in structure. Mild mitral annular calcification. Mild mitral valve regurgitation. No evidence of mitral valve stenosis.  Tricuspid Valve: The tricuspid valve is normal in structure. Tricuspid valve regurgitation is trivial. No evidence of tricuspid stenosis.  Aortic Valve: The aortic valve is normal in structure. Aortic valve regurgitation is not visualized. No aortic stenosis is present.  Pulmonic Valve: The pulmonic valve was normal in structure. Pulmonic valve regurgitation is trivial. No evidence of pulmonic stenosis.  Aorta: The aortic  root is normal in size and structure.  Venous: The inferior vena cava is normal in size with greater than 50% respiratory variability, suggesting right atrial pressure of 3 mmHg.  IAS/Shunts: No atrial level shunt detected by color flow Doppler.   LEFT VENTRICLE PLAX 2D LVIDd:         4.70 cm   Diastology LVIDs:         3.10 cm   LV e' medial:    8.05 cm/s LV PW:         0.90 cm   LV E/e' medial:  12.8 LV IVS:        0.80 cm   LV e' lateral:   12.10 cm/s LVOT diam:     2.00 cm   LV E/e' lateral: 8.5 LV SV:         95 LV SV Index:   55 LVOT Area:     3.14 cm  3D Volume EF: 3D EF:  57 % LV EDV:       140 ml LV ESV:       60 ml LV SV:        80 ml  RIGHT VENTRICLE RV Basal diam:  3.30 cm RV S prime:     13.50 cm/s TAPSE (M-mode): 1.7 cm RVSP:           16.2 mmHg  LEFT ATRIUM             Index        RIGHT ATRIUM           Index LA diam:        4.00 cm 2.31 cm/m   RA Pressure: 3.00 mmHg LA Vol (A2C):   54.3 ml 31.40 ml/m  RA Area:     13.10 cm LA Vol (A4C):   36.9 ml 21.34 ml/m  RA Volume:   31.60 ml  18.27 ml/m LA Biplane Vol: 46.9 ml 27.12 ml/m AORTIC VALVE LVOT Vmax:   125.00 cm/s LVOT Vmean:  82.900 cm/s LVOT VTI:    0.302 m  AORTA Ao Root diam: 3.50 cm Ao Asc diam:  3.50 cm  MITRAL VALVE                TRICUSPID VALVE MV Area (PHT):              TR Peak grad:   13.2 mmHg MV Decel Time:              TR Vmax:        182.00 cm/s MV E velocity: 103.00 cm/s  Estimated RAP:  3.00 mmHg MV A velocity: 81.20 cm/s   RVSP:           16.2 mmHg MV E/A ratio:  1.27 SHUNTS Systemic VTI:  0.30 m Systemic Diam: 2.00 cm  Toribio Fuel MD Electronically signed by Toribio Fuel MD Signature Date/Time: 11/18/2022/3:04:59 PM    Final      CT SCANS  CT CARDIAC SCORING (SELF PAY ONLY) 03/06/2020  Addendum 03/06/2020  3:42 PM ADDENDUM REPORT: 03/06/2020 15:39  CLINICAL DATA:  Risk stratification: 64 Year-old White Female  EXAM: Coronary Calcium   Score  TECHNIQUE: The patient was scanned on a [employer]. Axial non-contrast 3 mm slices were carried out through the heart. The data set was analyzed on a dedicated work station and scored using the Agatson method.  FINDINGS: Non-cardiac: See separate report from [facility].  Ascending Aorta: Mild dilation on non-contrasted study: 40 mm.  Pericardium: Normal.  Mitral Apparatus: Mild mitral annular calcification  Coronary arteries: Normal origins.  Coronary calcium  score of 40. This was 84th percentile for age, gender, and race matched controls.  IMPRESSION: 1. Coronary calcium  score of 40. This was 84th percentile for age, gender, and race matched controls.  2. Mild dilation of ascending aorta on non-contrasted study (40 mm); consider dedicated imaging assessment if clinically indicated.  3.  Mild mitral annular calcification.  Stanly Leavens, MD   Electronically Signed By: Stanly Leavens MD On: 03/06/2020 15:39  Narrative EXAM: OVER-READ INTERPRETATION  CT CHEST  The following report is an over-read performed by radiologist Dr. Toribio Aye of [facility] on 03/06/2020. This over-read does not include interpretation of cardiac or coronary anatomy or pathology. The coronary calcium  score interpretation by the cardiologist is attached.  COMPARISON:  None.  FINDINGS: Within the visualized portions of the thorax there are no suspicious appearing pulmonary nodules or masses, there is  no acute consolidative airspace disease, no pleural effusions, no pneumothorax and no lymphadenopathy. Visualized portions of the upper abdomen are unremarkable. There are no aggressive appearing lytic or blastic lesions noted in the visualized portions of the skeleton.  IMPRESSION: No significant incidental noncardiac findings are noted.  Electronically Signed: By: Toribio Aye M.D. On: 03/06/2020 10:03      ______________________________________________________________________________________________      Results Diagnostic Coronary artery calcium  score: Calcium  score 80  Risk Assessment/Calculations:          Physical Exam:    VS:  BP (!) 142/78 (BP Location: Right Arm, Patient Position: Sitting, Cuff Size: Normal)   Pulse 64   Ht 5' 4 (1.626 m)   Wt 170 lb (77.1 kg)   SpO2 94%   BMI 29.18 kg/m      Wt Readings from Last 3 Encounters:  11/02/22 145 lb 12.8 oz (66.1 kg)  08/19/21 187 lb (84.8 kg)  02/26/20 177 lb (80.3 kg)     GEN: Well nourished, well developed, in no acute distress NECK: No JVD; No carotid bruits CARDIAC: RRR, soft I/VI systolic murmur at RUSB, no rubs or gallops RESPIRATORY:  Clear to auscultation without rales, wheezing or rhonchi  ABDOMEN: Soft, non-tender, non-distended, normal bowel sounds EXTREMITIES:  Warm and well perfused, no edema; No deformity, 2+ radial pulses PSYCH: Normal mood and affect   ASSESSMENT AND PLAN: .    #Non-Obstructive CAD #HLD #Family History of CAD - The patient presents today for follow-up and wants to better understand her overall cardiovascular risk. - CAC score is 80 which she is in the intermediate range. - We will check an lipoprotein a and hs-CRP to better understand her cardiovascular risk. - Also due for lipid panel check. Lipoprotein a, lipid panel, hs-CRP Continue Crestor  20 mg daily Continue aspirin 81 mg daily Follow-up in 12 months  #Heart Murmur #Possible TAA - Patient has a faint systolic murmur heard at the RUSB which I think is probably a flow murmur but I am not sure. - I will get a complete echocardiogram to assess. - She also had a reported borderline TAA by CT imaging in 2022 that was not confirmed by echocardiography in 2024. - I will be able to get an adequate reassessment of her aorta by echo as well. - Lastly, I explained to the patient that PFO's and not hereditary and that 20% of  the patient population has them, but she is just curious to know if she has a PFO given that she has 2 family members with strokes in the setting of PFO's.  I will add on a bubble study to the echocardiogram since I am getting it anyway. Complete echocardiogram with bubble           This note was written with the assistance of a dictation microphone or AI dictation software. Please excuse any typos or grammatical errors.   Signed, Georganna Archer, MD  02/16/2024 12:52 PM    [facility] "

## 2024-02-16 NOTE — Patient Instructions (Signed)
" °  Lab Work: LP(a) Lipid panel  HS CRP  If you have labs (blood work) drawn today and your tests are completely normal, you will receive your results only by: MyChart Message (if you have MyChart) OR A paper copy in the mail If you have any lab test that is abnormal or we need to change your treatment, we will call you to review the results.  Testing/Procedures: ECHOCARDIOGRAM WITH BUBBLE  Your physician has requested that you have an echocardiogram. Echocardiography is a painless test that uses sound waves to create images of your heart. It provides your doctor with information about the size and shape of your heart and how well your hearts chambers and valves are working. This procedure takes approximately one hour. There are no restrictions for this procedure. Please do NOT wear cologne, perfume, aftershave, or lotions (deodorant is allowed). Please arrive 15 minutes prior to your appointment time.  Please note: We ask at that you not bring children with you during ultrasound (echo/ vascular) testing. Due to room size and safety concerns, children are not allowed in the ultrasound rooms during exams. Our front office staff cannot provide observation of children in our lobby area while testing is being conducted. An adult accompanying a patient to their appointment will only be allowed in the ultrasound room at the discretion of the ultrasound technician under special circumstances. We apologize for any inconvenience.   Follow-Up: At [facility], you and your health needs are our priority.  As part of our continuing mission to provide you with exceptional heart care, our providers are all part of one team.  This team includes your primary Cardiologist (physician) and Advanced Practice Providers or APPs (Physician Assistants and Nurse Practitioners) who all work together to provide you with the care you need, when you need it.  Your next appointment:   1 year(s)  Provider:    Georganna Archer, MD             "

## 2024-02-18 LAB — LIPOPROTEIN A (LPA): Lipoprotein (a): 523 nmol/L — ABNORMAL HIGH

## 2024-02-18 LAB — LIPID PANEL
Chol/HDL Ratio: 2.5 ratio (ref 0.0–4.4)
Cholesterol, Total: 213 mg/dL — ABNORMAL HIGH (ref 100–199)
HDL: 84 mg/dL
LDL Chol Calc (NIH): 109 mg/dL — ABNORMAL HIGH (ref 0–99)
Triglycerides: 118 mg/dL (ref 0–149)
VLDL Cholesterol Cal: 20 mg/dL (ref 5–40)

## 2024-02-18 LAB — HIGH SENSITIVITY CRP: CRP, High Sensitivity: 2.2 mg/L (ref 0.00–3.00)

## 2024-02-19 ENCOUNTER — Ambulatory Visit: Payer: Self-pay | Admitting: Student in an Organized Health Care Education/Training Program

## 2024-02-19 DIAGNOSIS — E78019 Familial hypercholesterolemia, unspecified: Secondary | ICD-10-CM

## 2024-02-27 ENCOUNTER — Encounter: Payer: Self-pay | Admitting: Student in an Organized Health Care Education/Training Program

## 2024-03-21 ENCOUNTER — Ambulatory Visit (HOSPITAL_COMMUNITY)

## 2024-04-13 ENCOUNTER — Ambulatory Visit: Admitting: Pharmacist Clinician (PhC)/ Clinical Pharmacy Specialist
